# Patient Record
Sex: Male | Born: 1975
Health system: Southern US, Community
[De-identification: ages and names within clinical notes are randomized; demographics above are authoritative.]

## PROBLEM LIST (undated history)

## (undated) DIAGNOSIS — E8881 Metabolic syndrome: Secondary | ICD-10-CM

## (undated) DIAGNOSIS — F988 Other specified behavioral and emotional disorders with onset usually occurring in childhood and adolescence: Secondary | ICD-10-CM

## (undated) DIAGNOSIS — M109 Gout, unspecified: Secondary | ICD-10-CM

## (undated) DIAGNOSIS — K429 Umbilical hernia without obstruction or gangrene: Secondary | ICD-10-CM

## (undated) HISTORY — DX: Umbilical hernia without obstruction or gangrene: K42.9

## (undated) HISTORY — DX: Metabolic syndrome: E88.810

## (undated) HISTORY — DX: Gout, unspecified: M10.9

## (undated) HISTORY — DX: Metabolic syndrome: E88.81

## (undated) HISTORY — PX: OTHER SURGICAL HISTORY: SHX169

## (undated) HISTORY — DX: Other specified behavioral and emotional disorders with onset usually occurring in childhood and adolescence: F98.8

---

## 2008-02-01 ENCOUNTER — Emergency Department (HOSPITAL_COMMUNITY): Admission: EM | Admit: 2008-02-01 | Discharge: 2008-02-01 | Payer: Self-pay | Admitting: Emergency Medicine
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
## 2011-01-04 LAB — DIFFERENTIAL
Basophils Absolute: 0
Basophils Relative: 1
Eosinophils Absolute: 0.1
Eosinophils Relative: 2
Lymphs Abs: 2.2
Neutro Abs: 6.2
Neutrophils Relative %: 67

## 2011-01-04 LAB — COMPREHENSIVE METABOLIC PANEL
AST: 25
Albumin: 3.9
Alkaline Phosphatase: 87
Calcium: 9
Chloride: 107
Creatinine, Ser: 0.74
GFR calc Af Amer: >60
GFR calc non Af Amer: >60
Glucose, Bld: 85
Total Protein: 6.6

## 2011-01-04 LAB — URINALYSIS, ROUTINE W REFLEX MICROSCOPIC
Glucose, UA: NEGATIVE
Hgb urine dipstick: NEGATIVE
Nitrite: NEGATIVE
Protein, ur: NEGATIVE
Specific Gravity, Urine: >1.046 — ABNORMAL HIGH

## 2011-01-04 LAB — CBC
HCT: 45.6
Hemoglobin: 15.3
MCHC: 33.5
MCV: 90
RBC: 5.06

## 2011-01-04 LAB — LIPASE, BLOOD: Lipase: 30

## 2015-06-08 ENCOUNTER — Encounter: Payer: Self-pay | Admitting: Family Medicine

## 2015-06-08 ENCOUNTER — Ambulatory Visit (INDEPENDENT_AMBULATORY_CARE_PROVIDER_SITE_OTHER): Payer: BLUE CROSS/BLUE SHIELD | Admitting: Family Medicine

## 2015-06-08 VITALS — BP 118/86 | HR 86 | Temp 99.0°F | Resp 18 | Ht 67.0 in | Wt 248.0 lb

## 2015-06-08 DIAGNOSIS — J019 Acute sinusitis, unspecified: Secondary | ICD-10-CM

## 2015-06-08 MED ORDER — HYDROCODONE-HOMATROPINE 5-1.5 MG/5ML PO SYRP
5.0000 mL | ORAL_SOLUTION | Freq: Three times a day (TID) | ORAL | Status: DC | PRN
Start: 2015-06-08 — End: 2019-03-05

## 2015-06-08 MED ORDER — AMOXICILLIN 875 MG PO TABS: 875.0000 mg | ORAL_TABLET | Freq: Two times a day (BID) | ORAL | Status: DC

## 2015-06-08 NOTE — Progress Notes (Signed)
   Subjective:    Patient ID: Peter Whitehead, male    DOB: 03/27/1976, 40 y.o.   MRN: [id number]  HPI Patient had symptoms of a cold starting last week about 7 days ago. He complains now of pain in his maxillary and frontal sinuses bilaterally. He also complains of right ear pain. He complains of a severe sinus headache. He complains of a nonproductive cough that is worse when he lies down at night. He denies any body aches. He does complain of fatigue. He complains of a low-grade fever. On examination today the right tympanic membrane is erythematous dull and bulging.  He denies any significant past medical history a past surgical history. No current outpatient prescriptions on file prior to visit.   No current facility-administered medications on file prior to visit.   No Known Allergies Social History   Social History  . Marital Status: Unknown    Spouse Name: N/A  . Number of Children: N/A  . Years of Education: N/A   Occupational History  . Not on file.   Social History Main Topics  . Smoking status: Former Games developermoker  . Smokeless tobacco: Not on file  . Alcohol Use: 0.0 oz/week    0 Standard drinks or equivalent per week     Comment: Occasional  . Drug Use: No  . Sexual Activity: Not on file   Other Topics Concern  . Not on file   Social History Narrative  . No narrative on file    Review of Systems  All other systems reviewed and are negative.      Objective:   Physical Exam  Constitutional: He appears well-developed and well-nourished.  HENT:  Head: Normocephalic.  Right Ear: External ear and ear canal normal. Tympanic membrane is erythematous and bulging.  Left Ear: Tympanic membrane, external ear and ear canal normal.  Nose: Mucosal edema and rhinorrhea present. Right sinus exhibits frontal sinus tenderness. Left sinus exhibits frontal sinus tenderness.  Mouth/Throat: Oropharynx is clear and moist. No oropharyngeal exudate.  Cardiovascular: Normal  rate, regular rhythm and normal heart sounds.   Pulmonary/Chest: Effort normal and breath sounds normal. No respiratory distress. He has no wheezes. He has no rales.  Skin: He is not diaphoretic.  Vitals reviewed.         Assessment & Plan:  Acute rhinosinusitis - Plan: amoxicillin (AMOXIL) 875 MG tablet, HYDROcodone-homatropine (HYCODAN) 5-1.5 MG/5ML syrup  Patient has a sinus infection and also developing a right-sided ear infection. Begin amoxicillin 875 mg by mouth twice a day for 10 days. He can continue to use Mucinex and Sudafed for congestion. Also recommended Hycodan 1 teaspoon every 8 hours as needed for cough

## 2016-08-01 ENCOUNTER — Encounter: Payer: BLUE CROSS/BLUE SHIELD | Admitting: Family Medicine

## 2016-12-27 ENCOUNTER — Encounter (INDEPENDENT_AMBULATORY_CARE_PROVIDER_SITE_OTHER): Payer: Self-pay | Admitting: Orthopaedic Surgery

## 2016-12-27 ENCOUNTER — Ambulatory Visit (INDEPENDENT_AMBULATORY_CARE_PROVIDER_SITE_OTHER): Payer: BLUE CROSS/BLUE SHIELD | Admitting: Orthopaedic Surgery

## 2016-12-27 DIAGNOSIS — G8929 Other chronic pain: Secondary | ICD-10-CM | POA: Diagnosis not present

## 2016-12-27 DIAGNOSIS — M25511 Pain in right shoulder: Secondary | ICD-10-CM | POA: Diagnosis not present

## 2016-12-27 NOTE — Progress Notes (Signed)
   Office Visit Note   Patient: Peter Whitehead           Date of Birth: 21-Mar-1976           MRN: [id number] Visit Date: 12/27/2016              Requested by: No referring provider defined for this encounter. PCP: Donita Brooks, MD   Assessment & Plan: Visit Diagnoses:  1. Chronic right shoulder pain     Plan: Overall impression is acromioclavicular arthritis versus subacromial bursitis. Subacromial injection was performed today. Patient tolerated well. Follow-up as follow-up as needed. needed.  Follow-Up Instructions: Return if symptoms worsen or fail to improve.   Orders:  No orders of the defined types were placed in this encounter.  No orders of the defined types were placed in this encounter.     Procedures: Large Joint Inj Date/Time: 12/27/2016 12:08 PM Performed by: Tarry Kos Authorized by: Tarry Kos   Consent Given by:  Patient Timeout: prior to procedure the correct patient, procedure, and site was verified   Indications:  Pain Location:  Shoulder Site:  R subacromial bursa Prep: patient was prepped and draped in usual sterile fashion   Needle Size:  22 G Approach:  Posterior Ultrasound Guidance: No   Fluoroscopic Guidance: No       Clinical Data: No additional findings.   Subjective: Chief Complaint  Patient presents with  . Right Shoulder - Pain, Follow-up    Patient comes in today for recurrent right shoulder pain. I previously saw him years ago for subacromial bursitis which resolved after an injection. He plays the drums and has difficulty with this. Denies any numbness and tingling or injuries.    Review of Systems  Constitutional: Negative.   All other systems reviewed and are negative.    Objective: Vital Signs: There were no vitals taken for this visit.  Physical Exam  Constitutional: He is oriented to person, place, and time. He appears well-developed and well-nourished.  Pulmonary/Chest: Effort normal.    Abdominal: Soft.  Neurological: He is alert and oriented to person, place, and time.  Skin: Skin is warm.  Psychiatric: He has a normal mood and affect. His behavior is normal. Judgment and thought content normal.  Nursing note and vitals reviewed.   Ortho Exam Right shoulder exam shows mildly positive impingement and cross adduction. Negative crank test. Rotator cuff is normal. Specialty Comments:  No specialty comments available.  Imaging: No results found.   PMFS History: There are no active problems to display for this patient.  No past medical history on file.  No family history on file.  No past surgical history on file. Social History   Occupational History  . Not on file.   Social History Main Topics  . Smoking status: Former Games developer  . Smokeless tobacco: Not on file  . Alcohol use 0.0 oz/week     Comment: Occasional  . Drug use: No  . Sexual activity: Not on file

## 2018-11-06 ENCOUNTER — Other Ambulatory Visit: Payer: Self-pay

## 2018-11-06 ENCOUNTER — Telehealth: Payer: Self-pay | Admitting: Family

## 2018-11-06 DIAGNOSIS — Z20822 Contact with and (suspected) exposure to covid-19: Secondary | ICD-10-CM

## 2018-11-06 NOTE — Progress Notes (Signed)
E-Visit for Corona Virus Screening   Your current symptoms could be consistent with the coronavirus.  Many health care providers can now test patients at their office but not all are.  [facility] has multiple testing sites. For information on our COVID testing locations and hours go to [email or web address]  Please quarantine yourself while awaiting your test results.  We are enrolling you in our MyChart Home Montioring for COVID19 . Daily you will receive a questionnaire within the MyChart website. Our COVID 19 response team willl be monitoriing your responses daily.    COVID-19 is a respiratory illness with symptoms that are similar to the flu. Symptoms are typically mild to moderate, but there have been cases of severe illness and death due to the virus. The following symptoms may appear 2-14 days after exposure: . Fever . Cough . Shortness of breath or difficulty breathing . Chills . Repeated shaking with chills . Muscle pain . Headache . Sore throat . New loss of taste or smell . Fatigue . Congestion or runny nose . Nausea or vomiting . Diarrhea  It is vitally important that if you feel that you have an infection such as this virus or any other virus that you stay home and away from places where you may spread it to others.  You should self-quarantine for 14 days if you have symptoms that could potentially be coronavirus or have been in close contact a with a person diagnosed with COVID-19 within the last 2 weeks. You should avoid contact with people age 65 and older.   You should wear a mask or cloth face covering over your nose and mouth if you must be around other people or animals, including pets (even at home). Try to stay at least 6 feet away from other people. This will protect the people around you.  You may also take acetaminophen (Tylenol) as needed for fever.   Reduce your risk of any infection by using the same precautions used for avoiding the  common cold or flu:  . Wash your hands often with soap and warm water for at least 20 seconds.  If soap and water are not readily available, use an alcohol-based hand sanitizer with at least 60% alcohol.  . If coughing or sneezing, cover your mouth and nose by coughing or sneezing into the elbow areas of your shirt or coat, into a tissue or into your sleeve (not your hands). . Avoid shaking hands with others and consider head nods or verbal greetings only. . Avoid touching your eyes, nose, or mouth with unwashed hands.  . Avoid close contact with people who are sick. . Avoid places or events with large numbers of people in one location, like concerts or sporting events. . Carefully consider travel plans you have or are making. . If you are planning any travel outside or inside the [place], visit the CDC's Travelers' Health webpage for the latest health notices. . If you have some symptoms but not all symptoms, continue to monitor at home and seek medical attention if your symptoms worsen. . If you are having a medical emergency, call 911.  HOME CARE . Only take medications as instructed by your medical team. . Drink plenty of fluids and get plenty of rest. . A steam or ultrasonic humidifier can help if you have congestion.   GET HELP RIGHT AWAY IF YOU HAVE EMERGENCY WARNING SIGNS** FOR COVID-19. If you or someone is showing any of these signs seek emergency medical care immediately. Call   911 or proceed to your closest emergency facility if: . You develop worsening high fever. . Trouble breathing . Bluish lips or face . Persistent pain or pressure in the chest . New confusion . Inability to wake or stay awake . You cough up blood. . Your symptoms become more severe  **This list is not all possible symptoms. Contact your medical provider for any symptoms that are sever or concerning to you.   MAKE SURE YOU   Understand these instructions.  Will watch your condition.  Will get help right  away if you are not doing well or get worse.  Your e-visit answers were reviewed by a board certified advanced clinical practitioner to complete your personal care plan.  Depending on the condition, your plan could have included both over the counter or prescription medications.  If there is a problem please reply once you have received a response from your provider.  Your safety is important to us.  If you have drug allergies check your prescription carefully.    You can use MyChart to ask questions about today's visit, request a non-urgent call back, or ask for a work or school excuse for 24 hours related to this e-Visit. If it has been greater than 24 hours you will need to follow up with your provider, or enter a new e-Visit to address those concerns. You will get an e-mail in the next two days asking about your experience.  I hope that your e-visit has been valuable and will speed your recovery. Thank you for using e-visits.   Greater than 5 minutes, yet less than 10 minutes of time have been spent researching, coordinating, and implementing care for this patient today.  Thank you for the details you included in the comment boxes. Those details are very helpful in determining the best course of treatment for you and help us to provide the best care.  

## 2018-11-07 LAB — NOVEL CORONAVIRUS, NAA: SARS-CoV-2, NAA: NOT DETECTED

## 2019-03-05 ENCOUNTER — Encounter: Payer: Self-pay | Admitting: Family Medicine

## 2019-03-05 ENCOUNTER — Other Ambulatory Visit: Payer: Self-pay

## 2019-03-05 ENCOUNTER — Ambulatory Visit: Payer: 59 | Admitting: Family Medicine

## 2019-03-05 VITALS — BP 128/66 | HR 94 | Temp 98.2°F | Resp 16 | Ht 67.0 in | Wt 275.0 lb

## 2019-03-05 DIAGNOSIS — M79671 Pain in right foot: Secondary | ICD-10-CM

## 2019-03-05 DIAGNOSIS — F5104 Psychophysiologic insomnia: Secondary | ICD-10-CM | POA: Diagnosis not present

## 2019-03-05 MED ORDER — MITIGARE 0.6 MG PO CAPS: 1.0000 | ORAL_CAPSULE | Freq: Every day | ORAL | 0 refills | Status: DC

## 2019-03-05 MED ORDER — COLCHICINE 0.6 MG PO TABS: 0.6000 mg | ORAL_TABLET | Freq: Every day | ORAL | 0 refills | Status: DC

## 2019-03-05 MED ORDER — ZOLPIDEM TARTRATE 10 MG PO TABS: 10.0000 mg | ORAL_TABLET | Freq: Every evening | ORAL | 1 refills | Status: DC | PRN

## 2019-03-05 NOTE — Progress Notes (Signed)
Subjective:    Patient ID: Peter Whitehead, male    DOB: 01-08-1976, 43 y.o.   MRN: [id number]  Patient presents for R Foot Pain (x2 weeks- pain on top/ bottom of foot- has been using tart cherry juice, aleve, advil) Patient here with right foot pain for the past 2 weeks.  He feels significant pain on the top of his fifth and left foot area as well as the bottom and the medicine tarsal arch area between the third and fourth digit.  He states that he feels like a burning sensation in that region.  He states if he turns over in bed covers hit his foot he will have pain.  Certain shoes also cause significant pain.  He does admit to drinking some beer and eating red meat he was concerned about gout.  He has been taking Aleve cherry tart juice over the past week or so and that has helped.  He has not had any injury to the foot that he is aware of.  His other concern is to sleep.  States that he takes 2 Benadryl at night as well as melatonin to help him sleep.  In the past he was on Ambien when he was going through some depressive episodes.  He was also on Adderall during the day and another antidepressant.  He is willing to try something else for his sleep. He denies any excessive snoring.  States that he does go to bed early if not he will feel more fatigued that he is often in the bed by 830 or 9:00 at the latest.  Note he is overdue for a physical exam.      Review Of Systems:  GEN- denies fatigue, fever, weight loss,weakness, recent illness HEENT- denies eye drainage, change in vision, nasal discharge, CVS- denies chest pain, palpitations RESP- denies SOB, cough, wheeze ABD- denies N/V, change in stools, abd pain GU- denies dysuria, hematuria, dribbling, incontinence MSK- + joint pain, muscle aches, injury Neuro- denies headache, dizziness, syncope, seizure activity       Objective:    BP 128/66   Pulse 94   Temp 98.2 F (36.8 C) (Temporal)   Resp 16   Ht 5\' 7"  (1.702 m)    Wt 275 lb (124.7 kg)   SpO2 99%   BMI 43.07 kg/m  GEN- NAD, alert and oriented x3,obese  CVS- RRR, no murmur RESP-CTAB EXT- Right foot, no erythema, TTP mid foot, below 3rd and 4th digits, and on sole, heel NT, FROM ankle, no swelling noted, no podagra  Pulses- Radial, DP- 2+        Assessment & Plan:      Problem List Items Addressed This Visit      Unprioritized   Chronic insomnia    Try him on Ambien.  He will schedule a physical exam.  I did query sleep apnea especially with his habitus.  Can be discussed further at his physical exam.  Regarding his right foot pain differentials include gout versus some type of tendinitis or even a neuroma on the sole of the foot.  We will check a uric acid level we will go ahead and start him on colchicine as the anti-inflammatories did help and he would like to try this.  Reduce the purine-based foods. If his levels do come back normal and no improvement with colchicine I will refer him to podiatry.       Other Visit Diagnoses    Right foot pain    -  Primary   Relevant Orders   CBC with Differential (Completed)   Comprehensive metabolic panel (Completed)   Uric Acid (Completed)      Note: This dictation was prepared with Dragon dictation along with smaller phrase technology. Any transcriptional errors that result from this process are unintentional.

## 2019-03-05 NOTE — Patient Instructions (Addendum)
Take Colchicine tablet 1 tablet, repeat repeat in 1 hour  Try the ambien for sleep  F/U Schedule physical Dr. Dennard Schaumann- he needs for work before end of year

## 2019-03-06 ENCOUNTER — Encounter: Payer: Self-pay | Admitting: Family Medicine

## 2019-03-06 LAB — COMPREHENSIVE METABOLIC PANEL
AG Ratio: 1.6 (calc) (ref 1.0–2.5)
ALT: 48 U/L — ABNORMAL HIGH (ref 9–46)
AST: 28 U/L (ref 10–40)
Albumin: 4.6 g/dL (ref 3.6–5.1)
Alkaline phosphatase (APISO): 82 U/L (ref 36–130)
BUN: 16 mg/dL (ref 7–25)
CO2: 21 mmol/L (ref 20–32)
Calcium: 9.5 mg/dL (ref 8.6–10.3)
Chloride: 103 mmol/L (ref 98–110)
Creat: 0.89 mg/dL (ref 0.60–1.35)
Globulin: 2.8 g/dL (calc) (ref 1.9–3.7)
Glucose, Bld: 116 mg/dL — ABNORMAL HIGH (ref 65–99)
Potassium: 4 mmol/L (ref 3.5–5.3)
Sodium: 138 mmol/L (ref 135–146)
Total Bilirubin: 0.4 mg/dL (ref 0.2–1.2)
Total Protein: 7.4 g/dL (ref 6.1–8.1)

## 2019-03-06 LAB — CBC WITH DIFFERENTIAL/PLATELET
Absolute Monocytes: 862 cells/uL (ref 200–950)
Basophils Absolute: 88 cells/uL (ref 0–200)
Basophils Relative: 0.9 %
Eosinophils Absolute: 206 cells/uL (ref 15–500)
Eosinophils Relative: 2.1 %
HCT: 48.8 % (ref 38.5–50.0)
Hemoglobin: 16.9 g/dL (ref 13.2–17.1)
Lymphs Abs: 2479 cells/uL (ref 850–3900)
MCH: 29.9 pg (ref 27.0–33.0)
MCHC: 34.6 g/dL (ref 32.0–36.0)
MCV: 86.2 fL (ref 80.0–100.0)
MPV: 11.7 fL (ref 7.5–12.5)
Monocytes Relative: 8.8 %
Neutro Abs: 6164 cells/uL (ref 1500–7800)
Neutrophils Relative %: 62.9 %
Platelets: 254 10*3/uL (ref 140–400)
RBC: 5.66 10*6/uL (ref 4.20–5.80)
RDW: 12.1 % (ref 11.0–15.0)
Total Lymphocyte: 25.3 %
WBC: 9.8 10*3/uL (ref 3.8–10.8)

## 2019-03-06 LAB — URIC ACID: Uric Acid, Serum: 6.3 mg/dL (ref 4.0–8.0)

## 2019-03-06 NOTE — Assessment & Plan Note (Signed)
Try him on Ambien.  He will schedule a physical exam.  I did query sleep apnea especially with his habitus.  Can be discussed further at his physical exam.  Regarding his right foot pain differentials include gout versus some type of tendinitis or even a neuroma on the sole of the foot.  We will check a uric acid level we will go ahead and start him on colchicine as the anti-inflammatories did help and he would like to try this.  Reduce the purine-based foods. If his levels do come back normal and no improvement with colchicine I will refer him to podiatry.

## 2019-03-11 ENCOUNTER — Encounter: Payer: Self-pay | Admitting: Family Medicine

## 2019-03-11 MED ORDER — PREDNISONE 10 MG PO TABS: ORAL_TABLET | ORAL | 0 refills | Status: DC

## 2019-05-09 ENCOUNTER — Other Ambulatory Visit: Payer: Self-pay

## 2019-05-09 ENCOUNTER — Encounter: Payer: Self-pay | Admitting: Family Medicine

## 2019-05-09 ENCOUNTER — Ambulatory Visit (INDEPENDENT_AMBULATORY_CARE_PROVIDER_SITE_OTHER): Payer: BC Managed Care – PPO | Admitting: Family Medicine

## 2019-05-09 VITALS — BP 130/90 | HR 74 | Temp 97.4°F | Resp 16 | Ht 67.0 in | Wt 260.0 lb

## 2019-05-09 DIAGNOSIS — Z0000 Encounter for general adult medical examination without abnormal findings: Secondary | ICD-10-CM

## 2019-05-09 DIAGNOSIS — Z0001 Encounter for general adult medical examination with abnormal findings: Secondary | ICD-10-CM

## 2019-05-09 DIAGNOSIS — Z8739 Personal history of other diseases of the musculoskeletal system and connective tissue: Secondary | ICD-10-CM | POA: Diagnosis not present

## 2019-05-09 DIAGNOSIS — K429 Umbilical hernia without obstruction or gangrene: Secondary | ICD-10-CM

## 2019-05-09 MED ORDER — ZOLPIDEM TARTRATE 10 MG PO TABS: 10.0000 mg | ORAL_TABLET | Freq: Every evening | ORAL | 1 refills | Status: DC | PRN

## 2019-05-09 NOTE — Progress Notes (Signed)
Subjective:    Patient ID: Peter Whitehead, male    DOB: 1976-03-24, 44 y.o.   MRN: [id number]  HPI Patient is here today for a complete physical exam.  He denies any medical concerns.  He has no significant past medical history however family history is concerning for a father had a myocardial infarction as well as peripheral artery disease in his late 2s early 2s.  He also had a AAA that required emergent correction.  Patient has a remote history of smoking but quit smoking 5 years ago.  He denies any chest pain, shortness of breath, dyspnea on exertion.  He is due for a flu shot as well as a tetanus shot but he declines both of these today.  He is also due for an HIV test however the patient declines that as well.   Past Medical History:  Diagnosis Date  . ADD (attention deficit disorder)    treated during childhood  . Hernia, umbilical    Past Surgical History:  Procedure Laterality Date  . pyloric stenosis     Current Outpatient Medications on File Prior to Visit  Medication Sig Dispense Refill  . FIBER ADULT GUMMIES PO Take by mouth.    . levocetirizine (XYZAL) 5 MG tablet Take 5 mg by mouth every evening.    . Misc Natural Products (TART CHERRY ADVANCED) CAPS Take by mouth.    . zolpidem (AMBIEN) 10 MG tablet Take 1 tablet (10 mg total) by mouth at bedtime as needed for sleep. (Patient not taking: Reported on 05/09/2019) 30 tablet 1   No current facility-administered medications on file prior to visit.   Allergies  Allergen Reactions  . Hydrocodone     Irritability    Social History   Socioeconomic History  . Marital status: Legally Separated    Spouse name: Not on file  . Number of children: Not on file  . Years of education: Not on file  . Highest education level: Not on file  Occupational History  . Not on file  Tobacco Use  . Smoking status: Former Games developer  . Smokeless tobacco: Never Used  Substance and Sexual Activity  . Alcohol use: Yes   Alcohol/week: 0.0 standard drinks    Comment: Occasional  . Drug use: No  . Sexual activity: Not on file  Other Topics Concern  . Not on file  Social History Narrative  . Not on file   Social Determinants of Health   Financial Resource Strain:   . Difficulty of Paying Living Expenses: Not on file  Food Insecurity:   . Worried About Programme researcher, broadcasting/film/video in the Last Year: Not on file  . Ran Out of Food in the Last Year: Not on file  Transportation Needs:   . Lack of Transportation (Medical): Not on file  . Lack of Transportation (Non-Medical): Not on file  Physical Activity:   . Days of Exercise per Week: Not on file  . Minutes of Exercise per Session: Not on file  Stress:   . Feeling of Stress : Not on file  Social Connections:   . Frequency of Communication with Friends and Family: Not on file  . Frequency of Social Gatherings with Friends and Family: Not on file  . Attends Religious Services: Not on file  . Active Member of Clubs or Organizations: Not on file  . Attends Banker Meetings: Not on file  . Marital Status: Not on file  Intimate Partner Violence:   .  Fear of Current or Ex-Partner: Not on file  . Emotionally Abused: Not on file  . Physically Abused: Not on file  . Sexually Abused: Not on file   Family History  Problem Relation Age of Onset  . Arthritis Mother        rheumatoid  . Heart disease Father   . Hyperlipidemia Father   . Hypertension Father   . AAA (abdominal aortic aneurysm) Father       Review of Systems  All other systems reviewed and are negative.      Objective:   Physical Exam Vitals reviewed.  Constitutional:      General: He is not in acute distress.    Appearance: Normal appearance. He is not ill-appearing, toxic-appearing or diaphoretic.  HENT:     Head: Normocephalic and atraumatic.     Right Ear: Tympanic membrane, ear canal and external ear normal.     Left Ear: Tympanic membrane, ear canal and external ear  normal.     Nose: Nose normal. No congestion or rhinorrhea.     Mouth/Throat:     Mouth: Mucous membranes are moist.     Pharynx: Oropharynx is clear. No oropharyngeal exudate or posterior oropharyngeal erythema.  Eyes:     General: No scleral icterus.       Right eye: No discharge.        Left eye: No discharge.     Extraocular Movements: Extraocular movements intact.     Conjunctiva/sclera: Conjunctivae normal.     Pupils: Pupils are equal, round, and reactive to light.  Neck:     Vascular: No carotid bruit.  Cardiovascular:     Rate and Rhythm: Normal rate and regular rhythm.     Pulses: Normal pulses.     Heart sounds: Normal heart sounds. No murmur. No friction rub. No gallop.   Pulmonary:     Effort: Pulmonary effort is normal. No respiratory distress.     Breath sounds: Normal breath sounds. No stridor. No wheezing, rhonchi or rales.  Chest:     Chest wall: No tenderness.  Abdominal:     General: Abdomen is flat. Bowel sounds are normal. There is no distension.     Palpations: Abdomen is soft. There is no mass.     Tenderness: There is no abdominal tenderness. There is no right CVA tenderness, left CVA tenderness, guarding or rebound.     Hernia: A hernia is present.  Musculoskeletal:        General: No signs of injury. Normal range of motion.     Cervical back: Neck supple. No rigidity or tenderness.     Right lower leg: No edema.     Left lower leg: No edema.  Lymphadenopathy:     Cervical: No cervical adenopathy.  Skin:    General: Skin is warm.     Coloration: Skin is not jaundiced or pale.     Findings: No bruising, erythema, lesion or rash.  Neurological:     General: No focal deficit present.     Mental Status: He is alert and oriented to person, place, and time. Mental status is at baseline.     Cranial Nerves: No cranial nerve deficit.     Sensory: No sensory deficit.     Motor: No weakness.     Coordination: Coordination normal.     Gait: Gait normal.      Deep Tendon Reflexes: Reflexes normal.  Psychiatric:        Mood and Affect:  Mood normal.        Behavior: Behavior normal.        Thought Content: Thought content normal.        Judgment: Judgment normal.           Assessment & Plan:  General medical exam - Plan: CBC with Differential/Platelet, COMPLETE METABOLIC PANEL WITH GFR, Lipid panel  Physical exam is significant only for an abdominal hernia/umbilical hernia.  This is a chronic finding that does not bother the patient.  He refuses a flu shot.  He refuses a tetanus shot.  He refuses HIV screening.  I will check a CBC, CMP, fasting lipid panel.  Previous lab work was significant for a mildly elevated liver function test making me concerned about possible fatty liver disease.  The patient also recently had a gout exacerbation.  He is aggressively tried to change his diet and is interested to see if he is lower his uric acid level in that way.  Most recent uric acid level was 6.3.  He is no longer smoking or dipping.  He drinks occasional alcohol.  I would have a low index to start the patient on a statin given his family history of cardiovascular disease.

## 2019-05-09 NOTE — Telephone Encounter (Signed)
Ok to refill??  Last office visit 05/09/2019.  Last refill 03/05/2019, #1 refills.

## 2019-05-10 ENCOUNTER — Encounter: Payer: Self-pay | Admitting: Family Medicine

## 2019-05-10 DIAGNOSIS — E8881 Metabolic syndrome: Secondary | ICD-10-CM | POA: Insufficient documentation

## 2019-05-10 DIAGNOSIS — M109 Gout, unspecified: Secondary | ICD-10-CM | POA: Insufficient documentation

## 2019-05-10 LAB — COMPLETE METABOLIC PANEL WITH GFR
AG Ratio: 1.9 (calc) (ref 1.0–2.5)
ALT: 44 U/L (ref 9–46)
AST: 28 U/L (ref 10–40)
Albumin: 4.8 g/dL (ref 3.6–5.1)
Alkaline phosphatase (APISO): 70 U/L (ref 36–130)
BUN: 11 mg/dL (ref 7–25)
CO2: 23 mmol/L (ref 20–32)
Calcium: 9.6 mg/dL (ref 8.6–10.3)
Chloride: 105 mmol/L (ref 98–110)
Creat: 0.87 mg/dL (ref 0.60–1.35)
GFR, Est African American: 123 mL/min/{1.73_m2} (ref >=60)
GFR, Est Non African American: 106 mL/min/{1.73_m2} (ref >=60)
Globulin: 2.5 g/dL (calc) (ref 1.9–3.7)
Glucose, Bld: 109 mg/dL — ABNORMAL HIGH (ref 65–99)
Potassium: 4.2 mmol/L (ref 3.5–5.3)
Sodium: 139 mmol/L (ref 135–146)
Total Bilirubin: 1 mg/dL (ref 0.2–1.2)
Total Protein: 7.3 g/dL (ref 6.1–8.1)

## 2019-05-10 LAB — LIPID PANEL
Cholesterol: 184 mg/dL (ref <200)
HDL: 39 mg/dL — ABNORMAL LOW (ref >=40)
LDL Cholesterol (Calc): 115 mg/dL (calc) — ABNORMAL HIGH
Non-HDL Cholesterol (Calc): 145 mg/dL (calc) — ABNORMAL HIGH (ref <130)
Total CHOL/HDL Ratio: 4.7 (calc) (ref <5.0)
Triglycerides: 186 mg/dL — ABNORMAL HIGH (ref <150)

## 2019-05-10 LAB — CBC WITH DIFFERENTIAL/PLATELET
Absolute Monocytes: 510 cells/uL (ref 200–950)
Basophils Absolute: 82 cells/uL (ref 0–200)
Basophils Relative: 1.2 %
Eosinophils Absolute: 231 cells/uL (ref 15–500)
Eosinophils Relative: 3.4 %
HCT: 48.3 % (ref 38.5–50.0)
Hemoglobin: 16.4 g/dL (ref 13.2–17.1)
Lymphs Abs: 2156 cells/uL (ref 850–3900)
MCH: 29.4 pg (ref 27.0–33.0)
MCHC: 34 g/dL (ref 32.0–36.0)
MCV: 86.7 fL (ref 80.0–100.0)
MPV: 11.1 fL (ref 7.5–12.5)
Monocytes Relative: 7.5 %
Neutro Abs: 3822 cells/uL (ref 1500–7800)
Neutrophils Relative %: 56.2 %
Platelets: 255 10*3/uL (ref 140–400)
RBC: 5.57 10*6/uL (ref 4.20–5.80)
RDW: 12.4 % (ref 11.0–15.0)
Total Lymphocyte: 31.7 %
WBC: 6.8 10*3/uL (ref 3.8–10.8)

## 2019-05-10 LAB — URIC ACID: Uric Acid, Serum: 7 mg/dL (ref 4.0–8.0)

## 2019-05-13 MED ORDER — MITIGARE 0.6 MG PO CAPS: 0.6000 mg | ORAL_CAPSULE | Freq: Every day | ORAL | 1 refills | Status: DC

## 2019-05-13 MED ORDER — COLCHICINE 0.6 MG PO TABS: 0.6000 mg | ORAL_TABLET | Freq: Every day | ORAL | 1 refills | Status: DC

## 2019-05-13 MED ORDER — ALLOPURINOL 100 MG PO TABS: 200.0000 mg | ORAL_TABLET | Freq: Every day | ORAL | 3 refills | Status: DC

## 2019-05-13 NOTE — Addendum Note (Signed)
Addended by: Legrand Rams B on: 05/13/2019 11:13 AM   Modules accepted: Orders

## 2019-05-13 NOTE — Telephone Encounter (Signed)
Medication called/sent to requested pharmacy  

## 2019-07-03 ENCOUNTER — Encounter: Payer: Self-pay | Admitting: Family Medicine

## 2019-07-04 MED ORDER — ZOLPIDEM TARTRATE 10 MG PO TABS: 10.0000 mg | ORAL_TABLET | Freq: Every evening | ORAL | 3 refills | Status: DC | PRN

## 2019-07-04 NOTE — Telephone Encounter (Signed)
Requesting refill    Ambien  LOV:  05/09/2019  LRF:  05/09/2019

## 2019-10-23 ENCOUNTER — Encounter: Payer: Self-pay | Admitting: Family Medicine

## 2019-10-23 MED ORDER — COLCHICINE 0.6 MG PO TABS: 0.6000 mg | ORAL_TABLET | Freq: Every day | ORAL | 1 refills | Status: DC

## 2019-10-23 MED ORDER — MITIGARE 0.6 MG PO CAPS: 0.6000 mg | ORAL_CAPSULE | Freq: Every day | ORAL | 1 refills | Status: DC

## 2019-10-23 MED ORDER — ALLOPURINOL 100 MG PO TABS: 200.0000 mg | ORAL_TABLET | Freq: Every day | ORAL | 3 refills | Status: DC

## 2019-10-30 ENCOUNTER — Other Ambulatory Visit: Payer: Self-pay

## 2019-10-30 ENCOUNTER — Encounter: Payer: Self-pay | Admitting: Family Medicine

## 2019-10-30 MED ORDER — ZOLPIDEM TARTRATE 10 MG PO TABS: 10.0000 mg | ORAL_TABLET | Freq: Every evening | ORAL | 3 refills | Status: DC | PRN

## 2019-10-30 NOTE — Telephone Encounter (Signed)
Last OV 05/09/19 °Last refill 07/04/19 °

## 2019-10-30 NOTE — Telephone Encounter (Signed)
Last OV 05/09/19 Last refill 07/04/19

## 2019-10-31 MED ORDER — ZOLPIDEM TARTRATE 10 MG PO TABS: 10.0000 mg | ORAL_TABLET | Freq: Every evening | ORAL | 3 refills | Status: DC | PRN

## 2019-10-31 NOTE — Telephone Encounter (Signed)
Please re-send as prescription was printed.  

## 2019-10-31 NOTE — Addendum Note (Signed)
Addended by: Phillips Odor on: 10/31/2019 08:25 AM   Modules accepted: Orders

## 2020-01-08 ENCOUNTER — Encounter: Payer: Self-pay | Admitting: Family Medicine

## 2020-01-08 MED ORDER — MITIGARE 0.6 MG PO CAPS: 0.6000 mg | ORAL_CAPSULE | Freq: Every day | ORAL | 3 refills | Status: DC

## 2020-01-10 MED ORDER — MITIGARE 0.6 MG PO CAPS: 0.6000 mg | ORAL_CAPSULE | Freq: Every day | ORAL | 3 refills | Status: DC

## 2020-02-21 ENCOUNTER — Encounter: Payer: Self-pay | Admitting: Family Medicine

## 2020-02-21 ENCOUNTER — Other Ambulatory Visit: Payer: Self-pay | Admitting: Family Medicine

## 2020-02-21 NOTE — Telephone Encounter (Signed)
Ok to refill??  Last office visit 05/09/2019.  Last refill 10/31/2019, #3 refills.

## 2020-03-19 ENCOUNTER — Other Ambulatory Visit: Payer: Self-pay

## 2020-03-19 MED ORDER — MITIGARE 0.6 MG PO CAPS: 0.6000 mg | ORAL_CAPSULE | Freq: Every day | ORAL | 3 refills | Status: DC

## 2020-06-14 ENCOUNTER — Encounter: Payer: Self-pay | Admitting: Family Medicine

## 2020-06-15 ENCOUNTER — Other Ambulatory Visit: Payer: Self-pay

## 2020-06-15 MED ORDER — ZOLPIDEM TARTRATE 10 MG PO TABS: 10.0000 mg | ORAL_TABLET | Freq: Every evening | ORAL | 3 refills | Status: DC | PRN

## 2020-06-15 NOTE — Telephone Encounter (Signed)
Ok to refill??  Last office visit 05/09/2019.  Last refill 02/21/2020, #3 refills.

## 2020-06-22 ENCOUNTER — Encounter: Payer: Self-pay | Admitting: Family Medicine

## 2020-06-22 ENCOUNTER — Other Ambulatory Visit: Payer: Self-pay

## 2020-06-22 ENCOUNTER — Ambulatory Visit (INDEPENDENT_AMBULATORY_CARE_PROVIDER_SITE_OTHER): Payer: BC Managed Care – PPO | Admitting: Family Medicine

## 2020-06-22 VITALS — BP 130/78 | HR 80 | Temp 98.6°F | Resp 14 | Ht 67.0 in | Wt 223.0 lb

## 2020-06-22 DIAGNOSIS — E8881 Metabolic syndrome: Secondary | ICD-10-CM | POA: Diagnosis not present

## 2020-06-22 DIAGNOSIS — Z0000 Encounter for general adult medical examination without abnormal findings: Secondary | ICD-10-CM

## 2020-06-22 DIAGNOSIS — Z0001 Encounter for general adult medical examination with abnormal findings: Secondary | ICD-10-CM

## 2020-06-22 DIAGNOSIS — Z8739 Personal history of other diseases of the musculoskeletal system and connective tissue: Secondary | ICD-10-CM | POA: Diagnosis not present

## 2020-06-22 MED ORDER — MONTELUKAST SODIUM 10 MG PO TABS: 10.0000 mg | ORAL_TABLET | Freq: Every day | ORAL | 3 refills | Status: DC

## 2020-06-22 NOTE — Progress Notes (Signed)
Subjective:    Patient ID: Peter Whitehead, male    DOB: [birth year], 45 y.o.   MRN: [id number]  HPI Patient is here today for a complete physical exam.  He denies any medical concerns.  He has no significant past medical history however family history is concerning for a father had a myocardial infarction as well as peripheral artery disease in his late 87s early 11s.  He also had a AAA that required emergent correction.  Patient has a remote history of smoking but quit smoking.  He is on allopurinol 200 mg a day however he continues to have a pressure-like sensation in his first MTP joint in his right foot if he does not take Medicare every day.  This leads me to suspect that his uric acid level is still greater than 6.  He denies any side effects from the allopurinol or the Mitigare.  He is taking Xyzal and Flonase on a daily basis for allergies however he continues to have hayfever type symptoms including runny nose and itchy watery eyes despite taking both these medications regularly.  He does not want to consider allergy shots yet.  He also has a history of what sounds like alpha gal allergy as he reports getting hives if he eats a rare steak or eats a lot of meat at a [place] steak house however he denies angioedema. Past Medical History:  Diagnosis Date  . ADD (attention deficit disorder)    treated during childhood  . Gout   . Hernia, umbilical   . Metabolic syndrome    Past Surgical History:  Procedure Laterality Date  . pyloric stenosis     Current Outpatient Medications on File Prior to Visit  Medication Sig Dispense Refill  . allopurinol (ZYLOPRIM) 100 MG tablet Take 2 tablets (200 mg total) by mouth daily. 180 tablet 3  . FIBER ADULT GUMMIES PO Take by mouth.    . fluticasone (FLONASE) 50 MCG/ACT nasal spray Place into both nostrils daily.    Marland Kitchen levocetirizine (XYZAL) 5 MG tablet Take 5 mg by mouth every evening.    . Misc Natural Products (TART CHERRY ADVANCED) CAPS  Take by mouth.    Marland Kitchen MITIGARE 0.6 MG CAPS Take 0.6 mg by mouth daily. 90 capsule 3  . naproxen sodium (ALEVE) 220 MG tablet Take 220 mg by mouth.    . zolpidem (AMBIEN) 10 MG tablet Take 1 tablet (10 mg total) by mouth at bedtime as needed. for sleep 30 tablet 3   No current facility-administered medications on file prior to visit.   Allergies  Allergen Reactions  . Hydrocodone     Irritability    Social History   Socioeconomic History  . Marital status: Legally Separated    Spouse name: Not on file  . Number of children: Not on file  . Years of education: Not on file  . Highest education level: Not on file  Occupational History  . Not on file  Tobacco Use  . Smoking status: Former Games developer  . Smokeless tobacco: Never Used  Vaping Use  . Vaping Use: Never used  Substance and Sexual Activity  . Alcohol use: Yes    Alcohol/week: 0.0 standard drinks    Comment: Occasional  . Drug use: No  . Sexual activity: Not on file  Other Topics Concern  . Not on file  Social History Narrative  . Not on file   Social Determinants of Health   Financial Resource Strain: Not on file  Food Insecurity: Not on file  Transportation Needs: Not on file  Physical Activity: Not on file  Stress: Not on file  Social Connections: Not on file  Intimate Partner Violence: Not on file   Family History  Problem Relation Age of Onset  . Arthritis Mother        rheumatoid  . Heart disease Father   . Hyperlipidemia Father   . Hypertension Father   . AAA (abdominal aortic aneurysm) Father       Review of Systems  All other systems reviewed and are negative.      Objective:   Physical Exam Vitals reviewed.  Constitutional:      General: He is not in acute distress.    Appearance: Normal appearance. He is not ill-appearing, toxic-appearing or diaphoretic.  HENT:     Head: Normocephalic and atraumatic.     Right Ear: Tympanic membrane, ear canal and external ear normal.     Left Ear:  Tympanic membrane, ear canal and external ear normal.     Nose: Nose normal. No congestion or rhinorrhea.     Mouth/Throat:     Mouth: Mucous membranes are moist.     Pharynx: Oropharynx is clear. No oropharyngeal exudate or posterior oropharyngeal erythema.  Eyes:     General: No scleral icterus.       Right eye: No discharge.        Left eye: No discharge.     Extraocular Movements: Extraocular movements intact.     Conjunctiva/sclera: Conjunctivae normal.     Pupils: Pupils are equal, round, and reactive to light.  Neck:     Vascular: No carotid bruit.  Cardiovascular:     Rate and Rhythm: Normal rate and regular rhythm.     Pulses: Normal pulses.     Heart sounds: Normal heart sounds. No murmur heard. No friction rub. No gallop.   Pulmonary:     Effort: Pulmonary effort is normal. No respiratory distress.     Breath sounds: Normal breath sounds. No stridor. No wheezing, rhonchi or rales.  Chest:     Chest wall: No tenderness.  Abdominal:     General: Abdomen is flat. Bowel sounds are normal. There is no distension.     Palpations: Abdomen is soft. There is no mass.     Tenderness: There is no abdominal tenderness. There is no right CVA tenderness, left CVA tenderness, guarding or rebound.     Hernia: A hernia is present.  Musculoskeletal:        General: No signs of injury. Normal range of motion.     Cervical back: Neck supple. No rigidity or tenderness.     Right lower leg: No edema.     Left lower leg: No edema.  Lymphadenopathy:     Cervical: No cervical adenopathy.  Skin:    General: Skin is warm.     Coloration: Skin is not jaundiced or pale.     Findings: No bruising, erythema, lesion or rash.  Neurological:     General: No focal deficit present.     Mental Status: He is alert and oriented to person, place, and time. Mental status is at baseline.     Cranial Nerves: No cranial nerve deficit.     Sensory: No sensory deficit.     Motor: No weakness.      Coordination: Coordination normal.     Gait: Gait normal.     Deep Tendon Reflexes: Reflexes normal.  Psychiatric:  Mood and Affect: Mood normal.        Behavior: Behavior normal.        Thought Content: Thought content normal.        Judgment: Judgment normal.           Assessment & Plan:  Metabolic syndrome - Plan: CBC with Differential/Platelet, COMPLETE METABOLIC PANEL WITH GFR, Lipid panel, Uric acid  General medical exam - Plan: CBC with Differential/Platelet, COMPLETE METABOLIC PANEL WITH GFR, Lipid panel, Uric acid  History of gout - Plan: Uric acid  Physical exam is significant only for an abdominal hernia/umbilical hernia.  This is a chronic finding that does not bother the patient.  Given his family history, I would recommend a statin if his LDL cholesterol is above 100.  I would also recommend adding Singulair 10 mg a day to his Xyzal and Flonase.  I will check a CBC, CMP, lipid panel and a uric acid level.  If uric acid is greater than 6 I would increase allopurinol to 300 mg a day.  I would also recommend checking the patient for a AAA when he turns 50 as his father was diagnosed with a AAA when he was 60.

## 2020-06-23 LAB — LIPID PANEL
Cholesterol: 156 mg/dL (ref <200)
HDL: 40 mg/dL (ref >=40)
LDL Cholesterol (Calc): 96 mg/dL (calc)
Non-HDL Cholesterol (Calc): 116 mg/dL (calc) (ref <130)
Total CHOL/HDL Ratio: 3.9 (calc) (ref <5.0)
Triglycerides: 100 mg/dL (ref <150)

## 2020-06-23 LAB — CBC WITH DIFFERENTIAL/PLATELET
Absolute Monocytes: 449 cells/uL (ref 200–950)
Basophils Absolute: 92 cells/uL (ref 0–200)
Basophils Relative: 1.4 %
Eosinophils Absolute: 172 cells/uL (ref 15–500)
Eosinophils Relative: 2.6 %
HCT: 47.1 % (ref 38.5–50.0)
Hemoglobin: 16.3 g/dL (ref 13.2–17.1)
Lymphs Abs: 1762 cells/uL (ref 850–3900)
MCH: 30.8 pg (ref 27.0–33.0)
MCHC: 34.6 g/dL (ref 32.0–36.0)
MCV: 88.9 fL (ref 80.0–100.0)
MPV: 11.4 fL (ref 7.5–12.5)
Monocytes Relative: 6.8 %
Neutro Abs: 4125 cells/uL (ref 1500–7800)
Neutrophils Relative %: 62.5 %
Platelets: 210 10*3/uL (ref 140–400)
RBC: 5.3 10*6/uL (ref 4.20–5.80)
RDW: 12.1 % (ref 11.0–15.0)
Total Lymphocyte: 26.7 %
WBC: 6.6 10*3/uL (ref 3.8–10.8)

## 2020-06-23 LAB — COMPLETE METABOLIC PANEL WITH GFR
AG Ratio: 2.2 (calc) (ref 1.0–2.5)
ALT: 27 U/L (ref 9–46)
AST: 21 U/L (ref 10–40)
Albumin: 4.9 g/dL (ref 3.6–5.1)
Alkaline phosphatase (APISO): 72 U/L (ref 36–130)
BUN: 13 mg/dL (ref 7–25)
CO2: 21 mmol/L (ref 20–32)
Calcium: 9.7 mg/dL (ref 8.6–10.3)
Chloride: 105 mmol/L (ref 98–110)
Creat: 0.91 mg/dL (ref 0.60–1.35)
GFR, Est African American: 118 mL/min/{1.73_m2} (ref >=60)
GFR, Est Non African American: 102 mL/min/{1.73_m2} (ref >=60)
Globulin: 2.2 g/dL (calc) (ref 1.9–3.7)
Glucose, Bld: 88 mg/dL (ref 65–99)
Potassium: 4.4 mmol/L (ref 3.5–5.3)
Sodium: 141 mmol/L (ref 135–146)
Total Bilirubin: 0.8 mg/dL (ref 0.2–1.2)
Total Protein: 7.1 g/dL (ref 6.1–8.1)

## 2020-06-23 LAB — URIC ACID: Uric Acid, Serum: 4.4 mg/dL (ref 4.0–8.0)

## 2020-09-16 ENCOUNTER — Other Ambulatory Visit: Payer: Self-pay | Admitting: *Deleted

## 2020-09-16 MED ORDER — MONTELUKAST SODIUM 10 MG PO TABS: 10.0000 mg | ORAL_TABLET | Freq: Every day | ORAL | 3 refills | Status: DC

## 2020-09-22 ENCOUNTER — Ambulatory Visit: Payer: Self-pay | Admitting: Family Medicine

## 2020-09-30 ENCOUNTER — Other Ambulatory Visit: Payer: Self-pay | Admitting: *Deleted

## 2020-09-30 MED ORDER — COLCHICINE 0.6 MG PO CAPS: 0.6000 mg | ORAL_CAPSULE | Freq: Every day | ORAL | 3 refills | Status: DC

## 2020-10-06 ENCOUNTER — Encounter: Payer: Self-pay | Admitting: Family Medicine

## 2020-10-06 ENCOUNTER — Other Ambulatory Visit: Payer: Self-pay | Admitting: Family Medicine

## 2020-10-07 NOTE — Telephone Encounter (Signed)
Ok to refill??  Last office visit 06/22/2020.  Last refill 06/15/2020, #3 refills.

## 2020-10-08 MED ORDER — ZOLPIDEM TARTRATE 10 MG PO TABS: 10.0000 mg | ORAL_TABLET | Freq: Every evening | ORAL | 3 refills | Status: DC | PRN

## 2021-02-03 ENCOUNTER — Encounter: Payer: Self-pay | Admitting: Family Medicine

## 2021-02-04 ENCOUNTER — Other Ambulatory Visit: Payer: Self-pay | Admitting: Family Medicine

## 2021-02-04 MED ORDER — ZOLPIDEM TARTRATE 10 MG PO TABS: 10.0000 mg | ORAL_TABLET | Freq: Every evening | ORAL | 3 refills | Status: DC | PRN

## 2021-02-04 NOTE — Telephone Encounter (Signed)
Please advise on refill request. Thank you

## 2021-03-29 ENCOUNTER — Other Ambulatory Visit: Payer: Self-pay | Admitting: Family Medicine

## 2021-04-20 ENCOUNTER — Other Ambulatory Visit: Payer: Self-pay | Admitting: Family Medicine

## 2021-04-20 ENCOUNTER — Other Ambulatory Visit: Payer: Self-pay

## 2021-04-20 ENCOUNTER — Ambulatory Visit: Payer: BC Managed Care – PPO | Admitting: Family Medicine

## 2021-04-20 ENCOUNTER — Encounter: Payer: Self-pay | Admitting: Family Medicine

## 2021-04-20 VITALS — BP 128/78 | HR 89 | Temp 97.7°F | Resp 18 | Ht 67.0 in | Wt 237.0 lb

## 2021-04-20 DIAGNOSIS — K429 Umbilical hernia without obstruction or gangrene: Secondary | ICD-10-CM

## 2021-04-20 DIAGNOSIS — M1A00X0 Idiopathic chronic gout, unspecified site, without tophus (tophi): Secondary | ICD-10-CM | POA: Diagnosis not present

## 2021-04-20 DIAGNOSIS — F902 Attention-deficit hyperactivity disorder, combined type: Secondary | ICD-10-CM

## 2021-04-20 MED ORDER — ALLOPURINOL 300 MG PO TABS: 300.0000 mg | ORAL_TABLET | Freq: Every day | ORAL | 6 refills | Status: DC

## 2021-04-20 MED ORDER — COLCHICINE 0.6 MG PO CAPS: 0.6000 mg | ORAL_CAPSULE | Freq: Every day | ORAL | 3 refills | Status: DC

## 2021-04-20 MED ORDER — AMPHETAMINE-DEXTROAMPHETAMINE 10 MG PO TABS: 10.0000 mg | ORAL_TABLET | Freq: Two times a day (BID) | ORAL | 0 refills | Status: DC

## 2021-04-20 MED ORDER — COLCHICINE 0.6 MG PO TABS: 0.6000 mg | ORAL_TABLET | Freq: Every day | ORAL | 3 refills | Status: DC

## 2021-04-20 NOTE — Progress Notes (Signed)
Subjective:    Patient ID: Peter Whitehead, male    DOB: December 11, 1975, 46 y.o.   MRN: [id number]  HPI  Patient presents today with 2 concerns.  First, he has been having more trouble with gout.  He is having to take colchicine on a daily basis due to pain in his feet.  Even though he is on allopurinol 200 mg a day, if he does not take the colchicine he experiences pain in his feet.  He has also tried different versions of colchicine.  Tablets do not work for him.  Despite taking the tablets he will still have gout pain particular in his MTP joint.  The capsules were better.  However, we need to complete a prior authorization apparently for the capsules per his insurance.  Second issue is ADD.  He has longstanding history of ADD.  He has trouble focusing.  Even during our conversation today, I can tell that he is drifting off and points while on trying to explain his treatment options.  He states that this happens to him all the time at work.  He also has a difficult time completing tasks on a timely basis.  He has to reread material multiple times in order to commit it to memory.  He finds himself easily distracted.  He has a problem with hyperactivity and impulse control.  In the past he saw psychiatrist and took Adderall 30 mg twice daily.  He states that that was entirely 2 months.  He saw benefit at lower doses.  His son has been battling ADD and the patient is interested in resuming his medication because he sees the benefit that his son has received from the medicine.  Third he would like a referral to a general surgeon to address the umbilical hernia that he has Past Medical History:  Diagnosis Date   ADD (attention deficit disorder)    treated during childhood   Gout    Hernia, umbilical    Metabolic syndrome    Past Surgical History:  Procedure Laterality Date   pyloric stenosis     Current Outpatient Medications on File Prior to Visit  Medication Sig Dispense Refill    allopurinol (ZYLOPRIM) 100 MG tablet TAKE 2 TABLETS BY MOUTH EVERY DAY 180 tablet 3   Colchicine (MITIGARE) 0.6 MG CAPS Take 0.6 mg by mouth daily. 90 capsule 3   FIBER ADULT GUMMIES PO Take by mouth.     fluticasone (FLONASE) 50 MCG/ACT nasal spray Place into both nostrils daily.     levocetirizine (XYZAL) 5 MG tablet Take 5 mg by mouth every evening.     Misc Natural Products (TART CHERRY ADVANCED) CAPS Take by mouth.     montelukast (SINGULAIR) 10 MG tablet Take 1 tablet (10 mg total) by mouth at bedtime. 90 tablet 3   naproxen sodium (ALEVE) 220 MG tablet Take 220 mg by mouth.     zolpidem (AMBIEN) 10 MG tablet Take 1 tablet (10 mg total) by mouth at bedtime as needed. for sleep 30 tablet 3   No current facility-administered medications on file prior to visit.   Allergies  Allergen Reactions   Hydrocodone     Irritability    Social History   Socioeconomic History   Marital status: Legally Separated    Spouse name: Not on file   Number of children: Not on file   Years of education: Not on file   Highest education level: Not on file  Occupational History   Not  on file  Tobacco Use   Smoking status: Former   Smokeless tobacco: Never  Vaping Use   Vaping Use: Never used  Substance and Sexual Activity   Alcohol use: Yes    Alcohol/week: 0.0 standard drinks    Comment: Occasional   Drug use: No   Sexual activity: Not on file  Other Topics Concern   Not on file  Social History Narrative   Not on file   Social Determinants of Health   Financial Resource Strain: Not on file  Food Insecurity: Not on file  Transportation Needs: Not on file  Physical Activity: Not on file  Stress: Not on file  Social Connections: Not on file  Intimate Partner Violence: Not on file   Family History  Problem Relation Age of Onset   Arthritis Mother        rheumatoid   Heart disease Father    Hyperlipidemia Father    Hypertension Father    AAA (abdominal aortic aneurysm) Father        Review of Systems  All other systems reviewed and are negative.     Objective:   Physical Exam Vitals reviewed.  Constitutional:      General: He is not in acute distress.    Appearance: Normal appearance. He is not ill-appearing, toxic-appearing or diaphoretic.  HENT:     Head: Normocephalic and atraumatic.     Nose: No congestion or rhinorrhea.  Neck:     Vascular: No carotid bruit.  Cardiovascular:     Rate and Rhythm: Normal rate and regular rhythm.     Pulses: Normal pulses.     Heart sounds: Normal heart sounds. No murmur heard.   No friction rub. No gallop.  Pulmonary:     Effort: Pulmonary effort is normal. No respiratory distress.     Breath sounds: Normal breath sounds. No stridor. No wheezing, rhonchi or rales.  Chest:     Chest wall: No tenderness.  Abdominal:     General: Abdomen is flat. Bowel sounds are normal. There is no distension.     Palpations: Abdomen is soft. There is no mass.     Tenderness: There is no abdominal tenderness. There is no right CVA tenderness, left CVA tenderness, guarding or rebound.     Hernia: A hernia is present.  Musculoskeletal:     Cervical back: Neck supple. No rigidity or tenderness.  Lymphadenopathy:     Cervical: No cervical adenopathy.  Neurological:     Mental Status: He is alert.          Assessment & Plan:  Umbilical hernia without obstruction and without gangrene - Plan: Ambulatory referral to General Surgery  Idiopathic chronic gout without tophus, unspecified site  Attention deficit hyperactivity disorder (ADHD), combined type First, regarding his gout, I explained the role of allopurinol to lower uric acid level of the blood.  The hope of this is that he would not need to take colchicine on a daily basis once the uric acid level has stabilized.  I would like the patient to increase his allopurinol to 300 mg a day.  Continue colchicine 0.6 mg daily for the first 3 months and then discontinue the  medication and only use colchicine on an as-needed basis after that point.  I will try to get the capsules approved for him as the tablets have not worked as well.  Secondly, I will start the patient on Adderall 10 mg twice daily.  I will recheck with the patient  electronically in 1 month to see how he is doing on that dose.  He will monitor his blood pressure and his heart rate.  Third, I will refer the patient to general surgery to discuss repair of his umbilical hernia

## 2021-04-20 NOTE — Telephone Encounter (Signed)
PA for Mitigare has been APPROVED [id number]  04/20/21-04/20/22  Pt aware.

## 2021-04-22 ENCOUNTER — Other Ambulatory Visit: Payer: Self-pay

## 2021-04-22 DIAGNOSIS — K429 Umbilical hernia without obstruction or gangrene: Secondary | ICD-10-CM

## 2021-04-23 MED ORDER — COLCHICINE 0.6 MG PO CAPS: 1.0000 | ORAL_CAPSULE | Freq: Every day | ORAL | 3 refills | Status: AC

## 2021-04-23 NOTE — Addendum Note (Signed)
Addended by: Shelby Dubin on: 04/23/2021 01:55 PM   Modules accepted: Orders

## 2021-05-20 ENCOUNTER — Other Ambulatory Visit: Payer: Self-pay | Admitting: Family Medicine

## 2021-05-21 MED ORDER — AMPHETAMINE-DEXTROAMPHETAMINE 10 MG PO TABS: 10.0000 mg | ORAL_TABLET | Freq: Two times a day (BID) | ORAL | 0 refills | Status: DC

## 2021-05-21 NOTE — Telephone Encounter (Signed)
LOV 04/20/21 Last refill 1/17/, #60, 0 refills  Please review, thanks!

## 2021-06-01 ENCOUNTER — Encounter: Payer: Self-pay | Admitting: Family Medicine

## 2021-06-01 MED ORDER — ZOLPIDEM TARTRATE 10 MG PO TABS: 10.0000 mg | ORAL_TABLET | Freq: Every evening | ORAL | 3 refills | Status: DC | PRN

## 2021-06-01 NOTE — Telephone Encounter (Signed)
LOV 04/20/21 Last refill 02/04/21, #30, 3 refills  Please review, thanks!

## 2021-06-21 ENCOUNTER — Other Ambulatory Visit: Payer: Self-pay | Admitting: Family Medicine

## 2021-06-22 MED ORDER — AMPHETAMINE-DEXTROAMPHETAMINE 10 MG PO TABS: 10.0000 mg | ORAL_TABLET | Freq: Two times a day (BID) | ORAL | 0 refills | Status: DC

## 2021-06-22 NOTE — Telephone Encounter (Signed)
Duplicate request

## 2021-06-22 NOTE — Telephone Encounter (Signed)
LOV 04/20/21 ?Last refill 05/21/21, #10mg , 0 refills ? ?Please review, thanks!  ?

## 2021-07-15 HISTORY — PX: HERNIA REPAIR: SHX51

## 2021-07-30 ENCOUNTER — Encounter: Payer: Self-pay | Admitting: Family Medicine

## 2021-07-30 MED ORDER — AMPHETAMINE-DEXTROAMPHETAMINE 10 MG PO TABS: 10.0000 mg | ORAL_TABLET | Freq: Two times a day (BID) | ORAL | 0 refills | Status: DC

## 2021-07-30 NOTE — Telephone Encounter (Signed)
LOV 04/20/21 ?Last refill 06/22/21, #60, 0 refills ? ?Please review, thanks! ? ? ?

## 2021-09-03 ENCOUNTER — Ambulatory Visit: Payer: BC Managed Care – PPO | Admitting: Family Medicine

## 2021-09-07 ENCOUNTER — Other Ambulatory Visit: Payer: Self-pay | Admitting: Family Medicine

## 2021-09-12 MED ORDER — AMPHETAMINE-DEXTROAMPHETAMINE 10 MG PO TABS: 10.0000 mg | ORAL_TABLET | Freq: Two times a day (BID) | ORAL | 0 refills | Status: DC

## 2021-09-17 ENCOUNTER — Ambulatory Visit (INDEPENDENT_AMBULATORY_CARE_PROVIDER_SITE_OTHER): Payer: BC Managed Care – PPO | Admitting: Family Medicine

## 2021-09-17 ENCOUNTER — Encounter: Payer: Self-pay | Admitting: Family Medicine

## 2021-09-17 VITALS — BP 124/90 | HR 73 | Temp 98.6°F | Ht 67.0 in | Wt 241.6 lb

## 2021-09-17 DIAGNOSIS — Z0000 Encounter for general adult medical examination without abnormal findings: Secondary | ICD-10-CM

## 2021-09-17 DIAGNOSIS — M1A00X0 Idiopathic chronic gout, unspecified site, without tophus (tophi): Secondary | ICD-10-CM | POA: Diagnosis not present

## 2021-09-17 DIAGNOSIS — F902 Attention-deficit hyperactivity disorder, combined type: Secondary | ICD-10-CM | POA: Diagnosis not present

## 2021-09-17 DIAGNOSIS — Z1211 Encounter for screening for malignant neoplasm of colon: Secondary | ICD-10-CM

## 2021-09-17 DIAGNOSIS — E8881 Metabolic syndrome: Secondary | ICD-10-CM | POA: Diagnosis not present

## 2021-09-17 NOTE — Progress Notes (Signed)
Subjective:    Patient ID: Peter Whitehead, male    DOB: 18-Apr-1975, 46 y.o.   MRN: [id number]  HPI Patient is here today for a complete physical exam.  He denies any medical concerns.  Since I last saw the patient, he had an umbilical hernia repair.  He gained weight after the umbilical hernia repair however he started exercising again and he wants to try to lose some weight.  Unfortunately since I last saw him, his father was diagnosed with renal cell carcinoma.  His father also has congestive heart failure, history of AAA, and his father recently had a hemorrhagic stroke.  His father is currently in the hospital in the ICU on a ventilator after the hemorrhagic stroke.  Obviously the patient is upset about this and this could be contributing some to his elevated blood pressure.  Aside from that he is doing relatively well.  He denies any other concerns.  He is due for colon cancer screening. Past Medical History:  Diagnosis Date   ADD (attention deficit disorder)    treated during childhood   Gout    Hernia, umbilical    Metabolic syndrome    Past Surgical History:  Procedure Laterality Date   pyloric stenosis     Current Outpatient Medications on File Prior to Visit  Medication Sig Dispense Refill   allopurinol (ZYLOPRIM) 300 MG tablet Take 1 tablet (300 mg total) by mouth daily. 30 tablet 6   amphetamine-dextroamphetamine (ADDERALL) 10 MG tablet Take 1 tablet (10 mg total) by mouth 2 (two) times daily. 60 tablet 0   Colchicine (MITIGARE) 0.6 MG CAPS Take 1 tablet by mouth daily. 90 capsule 3   FIBER ADULT GUMMIES PO Take by mouth.     fluticasone (FLONASE) 50 MCG/ACT nasal spray Place into both nostrils daily. (Patient not taking: Reported on 04/20/2021)     levocetirizine (XYZAL) 5 MG tablet Take 5 mg by mouth every evening. (Patient not taking: Reported on 04/20/2021)     Misc Natural Products (TART CHERRY ADVANCED) CAPS Take by mouth.     montelukast (SINGULAIR) 10 MG tablet  Take 1 tablet (10 mg total) by mouth at bedtime. 90 tablet 3   naproxen sodium (ALEVE) 220 MG tablet Take 220 mg by mouth.     TART CHERRY PO Take by mouth.     zolpidem (AMBIEN) 10 MG tablet Take 1 tablet (10 mg total) by mouth at bedtime as needed. for sleep 30 tablet 3   No current facility-administered medications on file prior to visit.   Allergies  Allergen Reactions   Hydrocodone     Irritability    Social History   Socioeconomic History   Marital status: Divorced    Spouse name: Not on file   Number of children: Not on file   Years of education: Not on file   Highest education level: Not on file  Occupational History   Not on file  Tobacco Use   Smoking status: Former   Smokeless tobacco: Never  Vaping Use   Vaping Use: Never used  Substance and Sexual Activity   Alcohol use: Yes    Alcohol/week: 0.0 standard drinks of alcohol    Comment: Occasional   Drug use: No   Sexual activity: Not on file  Other Topics Concern   Not on file  Social History Narrative   Not on file   Social Determinants of Health   Financial Resource Strain: Not on file  Food Insecurity: Not on  file  Transportation Needs: Not on file  Physical Activity: Not on file  Stress: Not on file  Social Connections: Not on file  Intimate Partner Violence: Not on file   Family History  Problem Relation Age of Onset   Arthritis Mother        rheumatoid   Heart disease Father    Hyperlipidemia Father    Hypertension Father    AAA (abdominal aortic aneurysm) Father       Review of Systems  All other systems reviewed and are negative.      Objective:   Physical Exam Vitals reviewed.  Constitutional:      General: He is not in acute distress.    Appearance: Normal appearance. He is not ill-appearing, toxic-appearing or diaphoretic.  HENT:     Head: Normocephalic and atraumatic.     Right Ear: Tympanic membrane, ear canal and external ear normal.     Left Ear: Tympanic membrane,  ear canal and external ear normal.     Nose: Nose normal. No congestion or rhinorrhea.     Mouth/Throat:     Mouth: Mucous membranes are moist.     Pharynx: Oropharynx is clear. No oropharyngeal exudate or posterior oropharyngeal erythema.  Eyes:     General: No scleral icterus.       Right eye: No discharge.        Left eye: No discharge.     Extraocular Movements: Extraocular movements intact.     Conjunctiva/sclera: Conjunctivae normal.     Pupils: Pupils are equal, round, and reactive to light.  Neck:     Vascular: No carotid bruit.  Cardiovascular:     Rate and Rhythm: Normal rate and regular rhythm.     Pulses: Normal pulses.     Heart sounds: Normal heart sounds. No murmur heard.    No friction rub. No gallop.  Pulmonary:     Effort: Pulmonary effort is normal. No respiratory distress.     Breath sounds: Normal breath sounds. No stridor. No wheezing, rhonchi or rales.  Chest:     Chest wall: No tenderness.  Abdominal:     General: Abdomen is flat. Bowel sounds are normal. There is no distension.     Palpations: Abdomen is soft. There is no mass.     Tenderness: There is no abdominal tenderness. There is no right CVA tenderness, left CVA tenderness, guarding or rebound.     Hernia: No hernia is present.  Musculoskeletal:        General: No signs of injury. Normal range of motion.     Cervical back: Neck supple. No rigidity or tenderness.     Right lower leg: No edema.     Left lower leg: No edema.  Lymphadenopathy:     Cervical: No cervical adenopathy.  Skin:    General: Skin is warm.     Coloration: Skin is not jaundiced or pale.     Findings: No bruising, erythema, lesion or rash.  Neurological:     General: No focal deficit present.     Mental Status: He is alert and oriented to person, place, and time. Mental status is at baseline.     Cranial Nerves: No cranial nerve deficit.     Sensory: No sensory deficit.     Motor: No weakness.     Coordination:  Coordination normal.     Gait: Gait normal.     Deep Tendon Reflexes: Reflexes normal.  Psychiatric:  Mood and Affect: Mood normal.        Behavior: Behavior normal.        Thought Content: Thought content normal.        Judgment: Judgment normal.           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  There is no palpable bruit or mass today on his abdominal exam.  I will check a CBC, CMP, lipid panel.  Given his history of metabolic syndrome and his recent weight gain I will also check an A1c.  Given his history of gout, I will check a uric acid.  His blood pressure today is borderline elevated.  Of asked him to check it at home and notify me of the values in a couple weeks.  We need to focus on controlling his blood pressure as the majority of his father's medical condition stem from high blood pressure.  He is also due for colon cancer screening.  We discussed Cologuard versus a colonoscopy and the patient elects to do a Cologuard test.  He is due for a tetanus shot but he declines that today

## 2021-09-18 LAB — URIC ACID: Uric Acid, Serum: 4.1 mg/dL (ref 4.0–8.0)

## 2021-09-18 LAB — COMPLETE METABOLIC PANEL WITH GFR
AG Ratio: 2 (calc) (ref 1.0–2.5)
ALT: 33 U/L (ref 9–46)
AST: 22 U/L (ref 10–40)
Albumin: 4.9 g/dL (ref 3.6–5.1)
Alkaline phosphatase (APISO): 78 U/L (ref 36–130)
BUN: 12 mg/dL (ref 7–25)
CO2: 21 mmol/L (ref 20–32)
Calcium: 9.7 mg/dL (ref 8.6–10.3)
Chloride: 105 mmol/L (ref 98–110)
Creat: 0.82 mg/dL (ref 0.60–1.29)
Globulin: 2.5 g/dL (calc) (ref 1.9–3.7)
Glucose, Bld: 87 mg/dL (ref 65–99)
Potassium: 4.1 mmol/L (ref 3.5–5.3)
Sodium: 139 mmol/L (ref 135–146)
Total Bilirubin: 1.1 mg/dL (ref 0.2–1.2)
Total Protein: 7.4 g/dL (ref 6.1–8.1)
eGFR: 110 mL/min/{1.73_m2} (ref >=60)

## 2021-09-18 LAB — CBC WITH DIFFERENTIAL/PLATELET
Absolute Monocytes: 554 cells/uL (ref 200–950)
Basophils Absolute: 70 cells/uL (ref 0–200)
Basophils Relative: 0.9 %
Eosinophils Absolute: 164 cells/uL (ref 15–500)
Eosinophils Relative: 2.1 %
HCT: 47.7 % (ref 38.5–50.0)
Hemoglobin: 16.9 g/dL (ref 13.2–17.1)
Lymphs Abs: 2285 cells/uL (ref 850–3900)
MCH: 31.3 pg (ref 27.0–33.0)
MCHC: 35.4 g/dL (ref 32.0–36.0)
MCV: 88.3 fL (ref 80.0–100.0)
MPV: 11 fL (ref 7.5–12.5)
Monocytes Relative: 7.1 %
Neutro Abs: 4727 cells/uL (ref 1500–7800)
Neutrophils Relative %: 60.6 %
Platelets: 237 10*3/uL (ref 140–400)
RBC: 5.4 10*6/uL (ref 4.20–5.80)
RDW: 12.2 % (ref 11.0–15.0)
Total Lymphocyte: 29.3 %
WBC: 7.8 10*3/uL (ref 3.8–10.8)

## 2021-09-18 LAB — HEMOGLOBIN A1C
Hgb A1c MFr Bld: 5.2 % of total Hgb (ref <5.7)
Mean Plasma Glucose: 103 mg/dL
eAG (mmol/L): 5.7 mmol/L

## 2021-09-18 LAB — LIPID PANEL
Cholesterol: 188 mg/dL (ref <200)
HDL: 41 mg/dL (ref >=40)
LDL Cholesterol (Calc): 120 mg/dL (calc) — ABNORMAL HIGH
Non-HDL Cholesterol (Calc): 147 mg/dL (calc) — ABNORMAL HIGH (ref <130)
Total CHOL/HDL Ratio: 4.6 (calc) (ref <5.0)
Triglycerides: 152 mg/dL — ABNORMAL HIGH (ref <150)

## 2021-09-28 ENCOUNTER — Other Ambulatory Visit: Payer: Self-pay | Admitting: Family Medicine

## 2021-09-29 NOTE — Telephone Encounter (Signed)
Requested medication (s) are due for refill today: yes  Requested medication (s) are on the active medication list: yes  Last refill:  06/01/21  Future visit scheduled: no  Notes to clinic:  Unable to refill per protocol, cannot delegate.      Requested Prescriptions  Pending Prescriptions Disp Refills   zolpidem (AMBIEN) 10 MG tablet [Pharmacy Med Name: ZOLPIDEM TARTRATE 10 MG TABLET] 30 tablet 3    Sig: Take 1 tablet (10 mg total) by mouth at bedtime as needed. for sleep     Not Delegated - Psychiatry:  Anxiolytics/Hypnotics Failed - 09/28/2021 10:39 PM      Failed - This refill cannot be delegated      Failed - Urine Drug Screen completed in last 360 days      Passed - Valid encounter within last 6 months    Recent Outpatient Visits           5 months ago Umbilical hernia without obstruction and without gangrene   [facility] Pickard, Priscille Heidelberg, MD   1 year ago Metabolic syndrome   [facility] Tanya Nones, Priscille Heidelberg, MD   2 years ago General medical exam   [facility] Donita Brooks, MD   2 years ago Right foot pain   [facility] [place], Velna Hatchet, MD   6 years ago Acute rhinosinusitis   [facility] Pickard, Priscille Heidelberg, MD

## 2021-09-30 ENCOUNTER — Encounter: Payer: Self-pay | Admitting: Family Medicine

## 2021-10-01 ENCOUNTER — Other Ambulatory Visit: Payer: Self-pay | Admitting: Family Medicine

## 2021-10-01 MED ORDER — ZOLPIDEM TARTRATE 10 MG PO TABS: 10.0000 mg | ORAL_TABLET | Freq: Every evening | ORAL | 3 refills | Status: DC | PRN

## 2021-10-01 NOTE — Telephone Encounter (Signed)
Requested Prescriptions  Pending Prescriptions Disp Refills  . allopurinol (ZYLOPRIM) 300 MG tablet [Pharmacy Med Name: ALLOPURINOL 300 MG TABLET] 90 tablet 3    Sig: TAKE 1 TABLET BY MOUTH EVERY DAY     Endocrinology:  Gout Agents - allopurinol Passed - 10/01/2021  2:09 AM      Passed - Uric Acid in normal range and within 360 days    Uric Acid, Serum  Date Value Ref Range Status  09/17/2021 4.1 4.0 - 8.0 mg/dL Final    Comment:    Therapeutic target for gout patients: <6.0 mg/dL .          Passed - Cr in normal range and within 360 days    Creat  Date Value Ref Range Status  09/17/2021 0.82 0.60 - 1.29 mg/dL Final         Passed - Valid encounter within last 12 months    Recent Outpatient Visits          5 months ago Umbilical hernia without obstruction and without gangrene   [facility] Pickard, Priscille Heidelberg, MD   1 year ago Metabolic syndrome   [facility] Pickard, Priscille Heidelberg, MD   2 years ago General medical exam   [facility] Donita Brooks, MD   2 years ago Right foot pain   [facility] [place], Velna Hatchet, MD   6 years ago Acute rhinosinusitis   [facility] Pickard, Priscille Heidelberg, MD             Passed - CBC within normal limits and completed in the last 12 months    WBC  Date Value Ref Range Status  09/17/2021 7.8 3.8 - 10.8 Thousand/uL Final   RBC  Date Value Ref Range Status  09/17/2021 5.40 4.20 - 5.80 Million/uL Final   Hemoglobin  Date Value Ref Range Status  09/17/2021 16.9 13.2 - 17.1 g/dL Final   HCT  Date Value Ref Range Status  09/17/2021 47.7 38.5 - 50.0 % Final   MCHC  Date Value Ref Range Status  09/17/2021 35.4 32.0 - 36.0 g/dL Final   [facility]  Date Value Ref Range Status  09/17/2021 31.3 27.0 - 33.0 pg Final   MCV  Date Value Ref Range Status  09/17/2021 88.3 80.0 - 100.0 fL Final   No results found for: "PLTCOUNTKUC", "LABPLAT", "POCPLA" RDW  Date  Value Ref Range Status  09/17/2021 12.2 11.0 - 15.0 % Final

## 2021-10-01 NOTE — Telephone Encounter (Signed)
LOV 09/17/21 Last refill 06/01/21, #30, 0 refills  Please review, thanks!

## 2021-10-19 ENCOUNTER — Other Ambulatory Visit: Payer: Self-pay | Admitting: Family Medicine

## 2021-10-19 MED ORDER — AMPHETAMINE-DEXTROAMPHETAMINE 10 MG PO TABS: 10.0000 mg | ORAL_TABLET | Freq: Two times a day (BID) | ORAL | 0 refills | Status: DC

## 2021-10-19 NOTE — Telephone Encounter (Signed)
LOV 09/17/21 Last refill 09/12/21, #60, 0 refills  Please review, thanks!

## 2021-11-17 ENCOUNTER — Other Ambulatory Visit: Payer: Self-pay | Admitting: Family Medicine

## 2021-11-19 MED ORDER — AMPHETAMINE-DEXTROAMPHETAMINE 10 MG PO TABS: 10.0000 mg | ORAL_TABLET | Freq: Two times a day (BID) | ORAL | 0 refills | Status: DC

## 2021-12-19 ENCOUNTER — Other Ambulatory Visit: Payer: Self-pay | Admitting: Family Medicine

## 2021-12-21 MED ORDER — AMPHETAMINE-DEXTROAMPHETAMINE 10 MG PO TABS: 10.0000 mg | ORAL_TABLET | Freq: Two times a day (BID) | ORAL | 0 refills | Status: DC

## 2021-12-30 ENCOUNTER — Ambulatory Visit (INDEPENDENT_AMBULATORY_CARE_PROVIDER_SITE_OTHER): Payer: Worker's Compensation | Admitting: Family Medicine

## 2021-12-30 VITALS — BP 140/70 | HR 90 | Temp 98.6°F | Ht 67.0 in | Wt 237.0 lb

## 2021-12-30 DIAGNOSIS — W57XXXA Bitten or stung by nonvenomous insect and other nonvenomous arthropods, initial encounter: Secondary | ICD-10-CM

## 2021-12-30 DIAGNOSIS — S80862A Insect bite (nonvenomous), left lower leg, initial encounter: Secondary | ICD-10-CM | POA: Diagnosis not present

## 2021-12-30 DIAGNOSIS — Z9189 Other specified personal risk factors, not elsewhere classified: Secondary | ICD-10-CM | POA: Diagnosis not present

## 2021-12-30 MED ORDER — DOXYCYCLINE HYCLATE 100 MG PO TABS: 100.0000 mg | ORAL_TABLET | Freq: Two times a day (BID) | ORAL | 0 refills | Status: AC

## 2021-12-30 NOTE — Progress Notes (Signed)
   Acute Office Visit  Subjective:     Patient ID: Peter Whitehead, male    DOB: 02-27-1976, 46 y.o.   MRN: [id number]  Chief Complaint  Patient presents with   Follow-up    Possible infected tick bite    Peter Whitehead presents today with concerns about a tick bite that occurred on September 13. He was able to pull off the tick but is unsure how long it was attached and unsure if the head was attached. Since then the site has become red, inflamed, itchy, and is getting worse and spreading. Associated symptoms include body aches, headache, fatigue, and irritability. He denies nausea/vomiting, generalized rash, neck stiffness, shortness of breath, or palpitations or chest pain. He has tried hydrocortisone cream with no relief.    Review of Systems  Constitutional:  Positive for malaise/fatigue.  Respiratory:  Negative for shortness of breath.   Cardiovascular:  Negative for chest pain and palpitations.  Gastrointestinal:  Negative for nausea and vomiting.  Musculoskeletal:  Positive for myalgias.  Skin:  Positive for rash (left calf).  Neurological:  Positive for headaches.        Objective:    BP (!) 140/70   Pulse 90   Temp 98.6 F (37 C) (Oral)   Ht 5\' 7"  (1.702 m)   Wt 237 lb (107.5 kg)   SpO2 96%   BMI 37.12 kg/m    Physical Exam Constitutional:      Appearance: Normal appearance.  Skin:    General: Skin is warm and dry.          Comments: Round, erythematous nodule with surrounding maculopapular rash  Neurological:     General: No focal deficit present.     Mental Status: He is alert and oriented to person, place, and time.  Psychiatric:        Mood and Affect: Mood normal.        Behavior: Behavior normal.        Thought Content: Thought content normal.        Judgment: Judgment normal.     No results found for any visits on 12/30/21.      Assessment & Plan:   Tick bite of left lower leg, initial encounter - Plan: B. burgdorfi antibodies by  WB, [place] spotted fvr abs pnl(IgG+IgM) I am concerned Peter Whitehead has early stage Lyme disease given his spreading erythema migrans as well as systemic symptoms. Testing for Lyme and RMSF today and will begin treatment with Doxycycline 100mg  BID for 21 days. Instructed to call back if symptoms worsen or change.  Return if symptoms worsen or fail to improve.  Peter Maid, FNP

## 2021-12-31 ENCOUNTER — Telehealth: Payer: Self-pay | Admitting: Family Medicine

## 2021-12-31 NOTE — Telephone Encounter (Signed)
Called Peter Whitehead back to give her the direct contact numbers of our billing dept, and asked her to contact them directly; no answer.  Left message on her vmail.

## 2021-12-31 NOTE — Telephone Encounter (Signed)
Received voicemail message from Wess Botts from [facility] re:   claim #: [id number]  Date of injury: 12/15/2021  Billing info: [address], [place],  [place] [address]  Fax #: Fx: [phone]  If faxing any correspondence, please write claim # on each page.  Outbound call placed to acknowledge message. No answer; left message.  For additional information, please advise Levada Dy at ph: [phone] and give claim #

## 2022-01-03 ENCOUNTER — Encounter: Payer: Self-pay | Admitting: Family Medicine

## 2022-01-04 ENCOUNTER — Telehealth: Payer: Self-pay | Admitting: Family Medicine

## 2022-01-04 LAB — B. BURGDORFI ANTIBODIES BY WB
B burgdorferi IgG Abs (IB): NEGATIVE
B burgdorferi IgM Abs (IB): NEGATIVE
Lyme Disease 18 kD IgG: NONREACTIVE
Lyme Disease 23 kD IgG: NONREACTIVE
Lyme Disease 23 kD IgM: NONREACTIVE
Lyme Disease 28 kD IgG: NONREACTIVE
Lyme Disease 30 kD IgG: NONREACTIVE
Lyme Disease 39 kD IgG: NONREACTIVE
Lyme Disease 39 kD IgM: NONREACTIVE
Lyme Disease 41 kD IgG: NONREACTIVE
Lyme Disease 41 kD IgM: NONREACTIVE
Lyme Disease 45 kD IgG: NONREACTIVE
Lyme Disease 58 kD IgG: REACTIVE — AB
Lyme Disease 66 kD IgG: NONREACTIVE
Lyme Disease 93 kD IgG: NONREACTIVE

## 2022-01-04 LAB — ROCKY MTN SPOTTED FVR ABS PNL(IGG+IGM)
RMSF IgG: NOT DETECTED
RMSF IgM: NOT DETECTED

## 2022-01-04 NOTE — Telephone Encounter (Signed)
Called Mr Beichner to report negative RMSF and schedule an appointment to be seen for shortness of breath, no answer, message left and will have CMA return call.

## 2022-01-20 ENCOUNTER — Encounter: Payer: Self-pay | Admitting: Family Medicine

## 2022-01-20 ENCOUNTER — Ambulatory Visit (INDEPENDENT_AMBULATORY_CARE_PROVIDER_SITE_OTHER): Payer: Worker's Compensation | Admitting: Family Medicine

## 2022-01-20 VITALS — BP 142/82 | HR 80 | Temp 98.9°F | Ht 67.0 in | Wt 241.0 lb

## 2022-01-20 DIAGNOSIS — S80862D Insect bite (nonvenomous), left lower leg, subsequent encounter: Secondary | ICD-10-CM

## 2022-01-20 DIAGNOSIS — W57XXXD Bitten or stung by nonvenomous insect and other nonvenomous arthropods, subsequent encounter: Secondary | ICD-10-CM | POA: Diagnosis not present

## 2022-01-20 NOTE — Progress Notes (Signed)
   Acute Office Visit  Subjective:     Patient ID: Peter Whitehead, male    DOB: 12/08/1975, 46 y.o.   MRN: [id number]  Chief Complaint  Patient presents with   Follow-up    F/u tick bite    HPI Patient is in today for follow-up for recent tick bite and Lyme treatment. His course of antibiotics is complete today. His symptoms have mostly resolved besides some residual fatigue.  Review of Systems  Constitutional: Negative.   Respiratory: Negative.    Cardiovascular: Negative.   Skin: Negative.   Neurological: Negative.   All other systems reviewed and are negative.       Objective:    BP (!) 142/82   Pulse 80   Temp 98.9 F (37.2 C) (Oral)   Ht 5\' 7"  (1.702 m)   Wt 241 lb (109.3 kg)   SpO2 98%   BMI 37.75 kg/m    Physical Exam Vitals and nursing note reviewed.  Constitutional:      Appearance: Normal appearance.  Skin:    General: Skin is warm and dry.  Neurological:     General: No focal deficit present.     Mental Status: He is alert and oriented to person, place, and time.  Psychiatric:        Mood and Affect: Mood normal.        Behavior: Behavior normal.        Thought Content: Thought content normal.        Judgment: Judgment normal.     No results found for any visits on 01/20/22.      Assessment & Plan:   1. Tick bite of left lower leg, subsequent encounter Patient returned for follow-up visit for suspected lyme disease. His early titers were all negative but he completed a course of antibiotics and his symptoms have resolved. 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 Encouraged to return to office if fatigue persists.   No orders of the defined types were placed in this encounter.   No follow-ups on file.  Rubie Maid, FNP

## 2022-01-24 ENCOUNTER — Other Ambulatory Visit: Payer: Self-pay | Admitting: Family Medicine

## 2022-01-25 NOTE — Telephone Encounter (Signed)
Requested medication (s) are due for refill today - yes  Requested medication (s) are on the active medication list -yes  Future visit scheduled -no  Last refill: 10/01/21 #30 3RF  Notes to clinic: non delegated Rx  Requested Prescriptions  Pending Prescriptions Disp Refills   zolpidem (AMBIEN) 10 MG tablet [Pharmacy Med Name: ZOLPIDEM TARTRATE 10 MG TABLET] 30 tablet 3    Sig: TAKE 1 TABLET BY MOUTH AT BEDTIME AS NEEDED. FOR SLEEP     Not Delegated - Psychiatry:  Anxiolytics/Hypnotics Failed - 01/24/2022  9:39 PM      Failed - This refill cannot be delegated      Failed - Urine Drug Screen completed in last 360 days      Failed - Valid encounter within last 6 months    Recent Outpatient Visits           9 months ago Umbilical hernia without obstruction and without gangrene   [place], Cammie Mcgee, MD   1 year ago Metabolic syndrome   [place] Pickard, Cammie Mcgee, MD   2 years ago General medical exam   [place] Susy Frizzle, MD   2 years ago Right foot pain   [place], [place] Nunnery, MD   6 years ago Acute rhinosinusitis   [place] Susy Frizzle, MD                 Requested Prescriptions  Pending Prescriptions Disp Refills   zolpidem (AMBIEN) 10 MG tablet [Pharmacy Med Name: ZOLPIDEM TARTRATE 10 MG TABLET] 30 tablet 3    Sig: TAKE 1 TABLET BY MOUTH AT BEDTIME AS NEEDED. FOR SLEEP     Not Delegated - Psychiatry:  Anxiolytics/Hypnotics Failed - 01/24/2022  9:39 PM      Failed - This refill cannot be delegated      Failed - Urine Drug Screen completed in last 360 days      Failed - Valid encounter within last 6 months    Recent Outpatient Visits           9 months ago Umbilical hernia without obstruction and without gangrene   [place] Susy Frizzle, MD   1 year ago Metabolic syndrome   [place]  [place], Cammie Mcgee, MD   2 years ago General medical exam   [place] Susy Frizzle, MD   2 years ago Right foot pain   [place], [place] Nunnery, MD   6 years ago Acute rhinosinusitis   [place] Pickard, Cammie Mcgee, MD

## 2022-02-18 LAB — COLOGUARD: COLOGUARD: NEGATIVE

## 2022-05-05 ENCOUNTER — Encounter: Payer: Self-pay | Admitting: Family Medicine

## 2022-05-05 ENCOUNTER — Ambulatory Visit: Payer: BC Managed Care – PPO | Admitting: Family Medicine

## 2022-05-05 VITALS — BP 124/72 | HR 79 | Temp 98.8°F | Ht 67.0 in | Wt 241.0 lb

## 2022-05-05 DIAGNOSIS — R0602 Shortness of breath: Secondary | ICD-10-CM | POA: Diagnosis not present

## 2022-05-05 DIAGNOSIS — J0110 Acute frontal sinusitis, unspecified: Secondary | ICD-10-CM | POA: Diagnosis not present

## 2022-05-05 MED ORDER — PREDNISONE 20 MG PO TABS: ORAL_TABLET | ORAL | 0 refills | Status: DC

## 2022-05-05 MED ORDER — ALPRAZOLAM 0.5 MG PO TABS: 0.5000 mg | ORAL_TABLET | Freq: Three times a day (TID) | ORAL | 0 refills | Status: DC | PRN

## 2022-05-05 MED ORDER — AMOXICILLIN-POT CLAVULANATE 875-125 MG PO TABS: 1.0000 | ORAL_TABLET | Freq: Two times a day (BID) | ORAL | 0 refills | Status: DC

## 2022-05-05 NOTE — Progress Notes (Signed)
Subjective:    Patient ID: Peter Whitehead, male    DOB: Jan 30, 1976, 47 y.o.   MRN: [id number]  HPI  Patient presents today complaining about the excessive need to "yawn."  However when I questioned the patient further he is actually gasping for air.  He states that ever since Christmas, he has been having sinus congestion.  He feels like his nasal passages are obstructed and that he is unable to breathe through his nasal passages.  He has been using Allegra, Flonase, and Astelin with minimal success.  Every minute or so, he feels like he has to take a sudden deep breath to catch his breath.  He will then feel better for a short period of time but the process repeats itself.  During our encounter today, he has to stop and gasp for breath 5 times while were talking.  His father died from heart disease.  His grandfather had a heart attack as well.  Patient denies any chest pain.  He actually states that he breathes better when he is active.  He is able to lift heavy objects and stack heavy material with no shortness of breath.  He is able to go up and down steps with no shortness of breath or chest pain.  He denies any pleurisy, cough, hemoptysis, wheezing.  He does report sinus headache and congestion off and on for the last 4 weeks. Past Medical History:  Diagnosis Date   ADD (attention deficit disorder)    treated during childhood   Gout    Hernia, umbilical    Metabolic syndrome    Past Surgical History:  Procedure Laterality Date   HERNIA REPAIR  07/15/2021   with mesh   pyloric stenosis     Current Outpatient Medications on File Prior to Visit  Medication Sig Dispense Refill   allopurinol (ZYLOPRIM) 300 MG tablet TAKE 1 TABLET BY MOUTH EVERY DAY 90 tablet 3   amphetamine-dextroamphetamine (ADDERALL) 10 MG tablet Take 1 tablet (10 mg total) by mouth 2 (two) times daily. (Patient not taking: Reported on 01/20/2022) 60 tablet 0   Colchicine (MITIGARE) 0.6 MG CAPS Take 1 tablet by  mouth daily. 90 capsule 3   FIBER ADULT GUMMIES PO Take by mouth.     fluticasone (FLONASE) 50 MCG/ACT nasal spray Place into both nostrils daily.     levocetirizine (XYZAL) 5 MG tablet Take 5 mg by mouth every evening.     Misc Natural Products (TART CHERRY ADVANCED) CAPS Take by mouth.     montelukast (SINGULAIR) 10 MG tablet Take 1 tablet (10 mg total) by mouth at bedtime. (Patient not taking: Reported on 01/20/2022) 90 tablet 3   naproxen sodium (ALEVE) 220 MG tablet Take 220 mg by mouth.     TART CHERRY PO Take by mouth.     zolpidem (AMBIEN) 10 MG tablet TAKE 1 TABLET BY MOUTH AT BEDTIME AS NEEDED. FOR SLEEP 30 tablet 3   No current facility-administered medications on file prior to visit.   Allergies  Allergen Reactions   Hydrocodone     Irritability    Social History   Socioeconomic History   Marital status: Divorced    Spouse name: Not on file   Number of children: Not on file   Years of education: Not on file   Highest education level: Not on file  Occupational History   Not on file  Tobacco Use   Smoking status: Former   Smokeless tobacco: Never  Vaping Use  Vaping Use: Never used  Substance and Sexual Activity   Alcohol use: Yes    Alcohol/week: 0.0 standard drinks of alcohol    Comment: Occasional   Drug use: No   Sexual activity: Not on file  Other Topics Concern   Not on file  Social History Narrative   Not on file   Social Determinants of Health   Financial Resource Strain: Not on file  Food Insecurity: Not on file  Transportation Needs: Not on file  Physical Activity: Not on file  Stress: Not on file  Social Connections: Not on file  Intimate Partner Violence: Not on file   Family History  Problem Relation Age of Onset   Arthritis Mother        rheumatoid   Heart disease Father    Hyperlipidemia Father    Hypertension Father    AAA (abdominal aortic aneurysm) Father       Review of Systems  All other systems reviewed and are  negative.      Objective:   Physical Exam Vitals reviewed.  Constitutional:      General: He is not in acute distress.    Appearance: Normal appearance. He is not ill-appearing, toxic-appearing or diaphoretic.  HENT:     Head: Normocephalic and atraumatic.     Right Ear: Tympanic membrane and ear canal normal.     Left Ear: Tympanic membrane and ear canal normal.     Nose: Congestion present. No rhinorrhea.     Right Turbinates: Swollen.     Left Turbinates: Swollen.     Right Sinus: Frontal sinus tenderness present.     Left Sinus: Frontal sinus tenderness present.  Neck:     Vascular: No carotid bruit.  Cardiovascular:     Rate and Rhythm: Normal rate and regular rhythm.     Pulses: Normal pulses.     Heart sounds: Normal heart sounds. No murmur heard.    No friction rub. No gallop.  Pulmonary:     Effort: Pulmonary effort is normal. No respiratory distress.     Breath sounds: Normal breath sounds. No stridor. No wheezing, rhonchi or rales.  Chest:     Chest wall: No tenderness.  Abdominal:     General: Abdomen is flat. Bowel sounds are normal.     Palpations: Abdomen is soft.  Musculoskeletal:     Cervical back: Neck supple. No rigidity or tenderness.  Lymphadenopathy:     Cervical: No cervical adenopathy.  Neurological:     Mental Status: He is alert.           Assessment & Plan:  Shortness of breath - Plan: CT CARDIAC SCORING (SELF PAY ONLY)  Acute frontal sinusitis, recurrence not specified First, I believe the patient is dealing with a sinus infection.  I will treat him with Augmentin and a prednisone taper pack.  However I believe that his episodic gasping due to dyspnea is likely related to anxiety and a sense of smothering.  I will give the patient Xanax to use as needed for anxiety.  I am also going to schedule the patient for a coronary artery CT to determine the extent of any coronary artery calcifications given his strong family history of coronary  artery disease.  I feel that this will help give the patient peace of mind that may alleviate some of his anxiety.  I do not believe that his dyspnea is related to his heart or his lungs given the fact that improves with activity  and he has no chest pain or dyspnea on exertion.  I believe this is more likely a subjective sense of smothering that he alleviates with a gasp.

## 2022-05-16 ENCOUNTER — Encounter: Payer: Self-pay | Admitting: Family Medicine

## 2022-05-18 ENCOUNTER — Encounter: Payer: Self-pay | Admitting: Family Medicine

## 2022-05-18 ENCOUNTER — Other Ambulatory Visit: Payer: Self-pay | Admitting: Family Medicine

## 2022-05-18 ENCOUNTER — Ambulatory Visit (HOSPITAL_COMMUNITY)
Admission: RE | Admit: 2022-05-18 | Discharge: 2022-05-18 | Disposition: A | Discharge status: Home / Self Care | Payer: BC Managed Care – PPO | Source: Ambulatory Visit | Attending: Family Medicine | Admitting: Family Medicine

## 2022-05-18 DIAGNOSIS — R0602 Shortness of breath: Secondary | ICD-10-CM | POA: Insufficient documentation

## 2022-05-19 ENCOUNTER — Other Ambulatory Visit: Payer: Self-pay | Admitting: Family Medicine

## 2022-05-19 ENCOUNTER — Telehealth: Payer: Self-pay

## 2022-05-19 MED ORDER — ZOLPIDEM TARTRATE 10 MG PO TABS: 10.0000 mg | ORAL_TABLET | Freq: Every evening | ORAL | 3 refills | Status: DC | PRN

## 2022-05-19 NOTE — Telephone Encounter (Signed)
Pt called requesting a refill on his Ambien. Last RF was 01/25/2022. Last OV 05/05/2022.  Pt uses [facility]. Thank you.

## 2022-05-19 NOTE — Telephone Encounter (Signed)
Requested medication (s) are due for refill today - yes  Requested medication (s) are on the active medication list -yes  Future visit scheduled -no  Last refill: 01/25/22 #30 3RF  Notes to clinic: non delegated Rx  Requested Prescriptions  Pending Prescriptions Disp Refills   zolpidem (AMBIEN) 10 MG tablet [Pharmacy Med Name: ZOLPIDEM TARTRATE 10 MG TABLET] 30 tablet 3    Sig: TAKE 1 TABLET BY MOUTH AT BEDTIME AS NEEDED. FOR SLEEP     Not Delegated - Psychiatry:  Anxiolytics/Hypnotics Failed - 05/18/2022  9:17 PM      Failed - This refill cannot be delegated      Failed - Urine Drug Screen completed in last 360 days      Failed - Valid encounter within last 6 months    Recent Outpatient Visits           1 year ago Umbilical hernia without obstruction and without gangrene   [place] Susy Frizzle, MD   1 year ago Metabolic syndrome   [place] Pickard, Cammie Mcgee, MD   3 years ago General medical exam   [place] Susy Frizzle, MD   3 years ago Right foot pain   [place], Modena Nunnery, MD   6 years ago Acute rhinosinusitis   [place] Susy Frizzle, MD                 Requested Prescriptions  Pending Prescriptions Disp Refills   zolpidem (AMBIEN) 10 MG tablet [Pharmacy Med Name: ZOLPIDEM TARTRATE 10 MG TABLET] 30 tablet 3    Sig: TAKE 1 TABLET BY MOUTH AT BEDTIME AS NEEDED. FOR SLEEP     Not Delegated - Psychiatry:  Anxiolytics/Hypnotics Failed - 05/18/2022  9:17 PM      Failed - This refill cannot be delegated      Failed - Urine Drug Screen completed in last 360 days      Failed - Valid encounter within last 6 months    Recent Outpatient Visits           1 year ago Umbilical hernia without obstruction and without gangrene   [place] Susy Frizzle, MD   1 year ago Metabolic syndrome   [place],  Cammie Mcgee, MD   3 years ago General medical exam   [place], Warren T, MD   3 years ago Right foot pain   [place], Modena Nunnery, MD   6 years ago Acute rhinosinusitis   [place] Pickard, Cammie Mcgee, MD

## 2022-05-26 ENCOUNTER — Ambulatory Visit: Payer: BC Managed Care – PPO | Admitting: Family Medicine

## 2022-05-26 ENCOUNTER — Encounter: Payer: Self-pay | Admitting: Family Medicine

## 2022-05-26 VITALS — BP 124/72 | HR 87 | Temp 98.0°F | Ht 67.0 in | Wt 251.0 lb

## 2022-05-26 DIAGNOSIS — J3489 Other specified disorders of nose and nasal sinuses: Secondary | ICD-10-CM

## 2022-05-26 NOTE — Progress Notes (Signed)
Subjective:    Patient ID: Peter Whitehead, male    DOB: 11-28-1975, 47 y.o.   MRN: [id number]  Sinus Problem  05/05/22  Patient presents today complaining about the excessive need to "yawn."  However when I questioned the patient further he is actually gasping for air.  He states that ever since Christmas, he has been having sinus congestion.  He feels like his nasal passages are obstructed and that he is unable to breathe through his nasal passages.  He has been using Allegra, Flonase, and Astelin with minimal success.  Every minute or so, he feels like he has to take a sudden deep breath to catch his breath.  He will then feel better for a short period of time but the process repeats itself.  During our encounter today, he has to stop and gasp for breath 5 times while were talking.  His father died from heart disease.  His grandfather had a heart attack as well.  Patient denies any chest pain.  He actually states that he breathes better when he is active.  He is able to lift heavy objects and stack heavy material with no shortness of breath.  He is able to go up and down steps with no shortness of breath or chest pain.  He denies any pleurisy, cough, hemoptysis, wheezing.  He does report sinus headache and congestion off and on for the last 4 weeks.  At that time, my plan was, First, I believe the patient is dealing with a sinus infection.  I will treat him with Augmentin and a prednisone taper pack.  However I believe that his episodic gasping due to dyspnea is likely related to anxiety and a sense of smothering.  I will give the patient Xanax to use as needed for anxiety.  I am also going to schedule the patient for a coronary artery CT to determine the extent of any coronary artery calcifications given his strong family history of coronary artery disease.  I feel that this will help give the patient peace of mind that may alleviate some of his anxiety.  I do not believe that his dyspnea is  related to his heart or his lungs given the fact that improves with activity and he has no chest pain or dyspnea on exertion.  I believe this is more likely a subjective sense of smothering that he alleviates with a gasp.    05/26/22 Patient saw no benefit after taking prednisone and the antibiotic.  Still feels like he is unable to take a full breath through his nose.  Periodically throughout our encounter he has to take a sudden deep breath to alleviate his sense of dyspnea.  However if he breathes through his mouth, he feels like he is getting plenty of air.  The CT scan of his chest revealed no pulmonary lesions.  Also his coronary calcium score was 0 making underlying coronary artery disease unlikely.  He has been trying Flonase and Astepro without any benefit.  Has been taking Zyrtec which helps some.  Xanax did not seem to help.  He does not believe that this is due to underlying anxiety Past Medical History:  Diagnosis Date   ADD (attention deficit disorder)    treated during childhood   Gout    Hernia, umbilical    Metabolic syndrome    Past Surgical History:  Procedure Laterality Date   HERNIA REPAIR  07/15/2021   with mesh   pyloric stenosis  Current Outpatient Medications on File Prior to Visit  Medication Sig Dispense Refill   allopurinol (ZYLOPRIM) 300 MG tablet TAKE 1 TABLET BY MOUTH EVERY DAY 90 tablet 3   ALPRAZolam (XANAX) 0.5 MG tablet Take 1 tablet (0.5 mg total) by mouth 3 (three) times daily as needed for anxiety. 20 tablet 0   amoxicillin-clavulanate (AUGMENTIN) 875-125 MG tablet Take 1 tablet by mouth 2 (two) times daily. 20 tablet 0   Colchicine (MITIGARE) 0.6 MG CAPS Take 1 tablet by mouth daily. 90 capsule 3   FIBER ADULT GUMMIES PO Take by mouth.     Misc Natural Products (TART CHERRY ADVANCED) CAPS Take by mouth.     naproxen sodium (ALEVE) 220 MG tablet Take 220 mg by mouth.     predniSONE (DELTASONE) 20 MG tablet 3 tabs poqday 1-2, 2 tabs poqday 3-4, 1 tab  poqday 5-6 12 tablet 0   TART CHERRY PO Take by mouth.     zolpidem (AMBIEN) 10 MG tablet Take 1 tablet (10 mg total) by mouth at bedtime as needed. for sleep 30 tablet 3   No current facility-administered medications on file prior to visit.   Allergies  Allergen Reactions   Hydrocodone     Irritability    Social History   Socioeconomic History   Marital status: Divorced    Spouse name: Not on file   Number of children: Not on file   Years of education: Not on file   Highest education level: Not on file  Occupational History   Not on file  Tobacco Use   Smoking status: Former   Smokeless tobacco: Never  Vaping Use   Vaping Use: Never used  Substance and Sexual Activity   Alcohol use: Yes    Alcohol/week: 0.0 standard drinks of alcohol    Comment: Occasional   Drug use: No   Sexual activity: Not on file  Other Topics Concern   Not on file  Social History Narrative   Not on file   Social Determinants of Health   Financial Resource Strain: Not on file  Food Insecurity: Not on file  Transportation Needs: Not on file  Physical Activity: Not on file  Stress: Not on file  Social Connections: Not on file  Intimate Partner Violence: Not on file   Family History  Problem Relation Age of Onset   Arthritis Mother        rheumatoid   Heart disease Father    Hyperlipidemia Father    Hypertension Father    AAA (abdominal aortic aneurysm) Father       Review of Systems  All other systems reviewed and are negative.      Objective:   Physical Exam Vitals reviewed.  Constitutional:      General: He is not in acute distress.    Appearance: Normal appearance. He is not ill-appearing, toxic-appearing or diaphoretic.  HENT:     Head: Normocephalic and atraumatic.     Right Ear: Tympanic membrane and ear canal normal.     Left Ear: Tympanic membrane and ear canal normal.     Nose: No congestion or rhinorrhea.     Right Turbinates: Not swollen.     Left Turbinates:  Not swollen.     Right Sinus: No frontal sinus tenderness.     Left Sinus: No frontal sinus tenderness.  Neck:     Vascular: No carotid bruit.  Cardiovascular:     Rate and Rhythm: Normal rate and regular rhythm.  Pulses: Normal pulses.     Heart sounds: Normal heart sounds. No murmur heard.    No friction rub. No gallop.  Pulmonary:     Effort: Pulmonary effort is normal. No respiratory distress.     Breath sounds: Normal breath sounds. No stridor. No wheezing, rhonchi or rales.  Chest:     Chest wall: No tenderness.  Abdominal:     General: Abdomen is flat. Bowel sounds are normal.     Palpations: Abdomen is soft.  Musculoskeletal:     Cervical back: Neck supple. No rigidity or tenderness.  Lymphadenopathy:     Cervical: No cervical adenopathy.  Neurological:     Mental Status: He is alert.           Assessment & Plan:  Nasal obstruction Patient has a subjective sensation of the nasal obstruction.  I tried treating the patient empirically for sinus infection.  He has tried [employer] pot.  He has tried nasal steroids.  He has tried Astelin.  I also worked patient up for underlying coronary artery disease as well as pulmonary issues with a CT cardiac score.  Therefore nothing has helped.  I believe this is likely anxiety related.  I had this discussion with the patient.  We discussed possibly getting an echocardiogram to evaluate for any cardiomyopathy however the patient states he can breathe fine as long as he breathes through his mouth.  Therefore I believe the next prudent course of action will be an ENT consultation for direct examination of the nasopharynx.  Consult ENT

## 2022-05-30 ENCOUNTER — Ambulatory Visit: Payer: BC Managed Care – PPO | Admitting: Family Medicine

## 2022-09-14 ENCOUNTER — Other Ambulatory Visit: Payer: Self-pay | Admitting: Family Medicine

## 2022-09-15 ENCOUNTER — Encounter: Payer: Self-pay | Admitting: Family Medicine

## 2022-09-15 NOTE — Telephone Encounter (Signed)
Requested medication (s) are due for refill today:   Yes for both  Requested medication (s) are on the active medication list:   Yes for both  Future visit scheduled:   Yes 09/19/2022    LOV 05/05/2022   Last ordered: Ambien 05/19/2022 #30, 3 refills;   Allopurinol 10/01/2021 #90, 3 refills  Returned because labs are due and a non delegated refill    Requested Prescriptions  Pending Prescriptions Disp Refills   zolpidem (AMBIEN) 10 MG tablet [Pharmacy Med Name: ZOLPIDEM TARTRATE 10 MG TABLET] 30 tablet 3    Sig: Take 1 tablet (10 mg total) by mouth at bedtime as needed. for sleep     Not Delegated - Psychiatry:  Anxiolytics/Hypnotics Failed - 09/14/2022 10:07 PM      Failed - This refill cannot be delegated      Failed - Urine Drug Screen completed in last 360 days      Failed - Valid encounter within last 6 months    Recent Outpatient Visits           1 year ago Umbilical hernia without obstruction and without gangrene   [facility] Donita Brooks, MD   2 years ago Metabolic syndrome   [facility] Pickard, Priscille Heidelberg, MD   3 years ago General medical exam   [facility] Donita Brooks, MD   3 years ago Right foot pain   [facility] [place], Velna Hatchet, MD   7 years ago Acute rhinosinusitis   [facility] Pickard, Priscille Heidelberg, MD       Future Appointments             In 4 days Pickard, Priscille Heidelberg, MD [facility]             allopurinol (ZYLOPRIM) 300 MG tablet [Pharmacy Med Name: ALLOPURINOL 300 MG TABLET] 90 tablet 3    Sig: TAKE 1 TABLET BY MOUTH EVERY DAY     Endocrinology:  Gout Agents - allopurinol Failed - 09/14/2022 10:07 PM      Failed - Uric Acid in normal range and within 360 days    Uric Acid, Serum  Date Value Ref Range Status  09/17/2021 4.1 4.0 - 8.0 mg/dL Final    Comment:    Therapeutic target for gout patients: <6.0 mg/dL .           Failed - Cr in normal range and within 360 days    Creat  Date Value Ref Range Status  09/17/2021 0.82 0.60 - 1.29 mg/dL Final         Failed - Valid encounter within last 12 months    Recent Outpatient Visits           1 year ago Umbilical hernia without obstruction and without gangrene   [facility] Pickard, Priscille Heidelberg, MD   2 years ago Metabolic syndrome   [facility] [place], Priscille Heidelberg, MD   3 years ago General medical exam   [facility] Donita Brooks, MD   3 years ago Right foot pain   [facility] [place], Velna Hatchet, MD   7 years ago Acute rhinosinusitis   [facility] Pickard, Priscille Heidelberg, MD       Future Appointments             In 4 days Donita Brooks, MD  [place] [facility]            Failed - CBC within normal limits and completed in the last 12 months    WBC  Date Value Ref Range Status  09/17/2021 7.8 3.8 - 10.8 Thousand/uL Final   RBC  Date Value Ref Range Status  09/17/2021 5.40 4.20 - 5.80 Million/uL Final   Hemoglobin  Date Value Ref Range Status  09/17/2021 16.9 13.2 - 17.1 g/dL Final   HCT  Date Value Ref Range Status  09/17/2021 47.7 38.5 - 50.0 % Final   MCHC  Date Value Ref Range Status  09/17/2021 35.4 32.0 - 36.0 g/dL Final   [facility]  Date Value Ref Range Status  09/17/2021 31.3 27.0 - 33.0 pg Final   MCV  Date Value Ref Range Status  09/17/2021 88.3 80.0 - 100.0 fL Final   No results found for: "PLTCOUNTKUC", "LABPLAT", "POCPLA" RDW  Date Value Ref Range Status  09/17/2021 12.2 11.0 - 15.0 % Final

## 2022-09-16 ENCOUNTER — Other Ambulatory Visit: Payer: Self-pay | Admitting: Family Medicine

## 2022-09-16 ENCOUNTER — Telehealth: Payer: Self-pay

## 2022-09-16 MED ORDER — ALLOPURINOL 300 MG PO TABS: 300.0000 mg | ORAL_TABLET | Freq: Every day | ORAL | 3 refills | Status: DC

## 2022-09-16 MED ORDER — ZOLPIDEM TARTRATE 10 MG PO TABS: 10.0000 mg | ORAL_TABLET | Freq: Every evening | ORAL | 3 refills | Status: DC | PRN

## 2022-09-16 NOTE — Telephone Encounter (Signed)
Pt sent my chart message requesting a refill on Ambien and Allopurinol.  Ambien, last RF 05/19/2022. Allopurinol, last RF 10/01/2021.  Pt has an appointment with you on 09/19/2022. Thank you.

## 2022-09-19 ENCOUNTER — Ambulatory Visit: Payer: BC Managed Care – PPO | Admitting: Family Medicine

## 2022-09-19 VITALS — BP 124/72 | HR 83 | Temp 98.2°F | Ht 67.0 in | Wt 245.2 lb

## 2022-09-19 DIAGNOSIS — F419 Anxiety disorder, unspecified: Secondary | ICD-10-CM | POA: Diagnosis not present

## 2022-09-19 NOTE — Progress Notes (Signed)
Subjective:    Patient ID: Peter Whitehead, male    DOB: 07-27-1975, 47 y.o.   MRN: [id number]  HPI 05/05/22 Patient presents today complaining about the excessive need to "yawn."  However when I questioned the patient further he is actually gasping for air.  He states that ever since Christmas, he has been having sinus congestion.  He feels like his nasal passages are obstructed and that he is unable to breathe through his nasal passages.  He has been using Allegra, Flonase, and Astelin with minimal success.  Every minute or so, he feels like he has to take a sudden deep breath to catch his breath.  He will then feel better for a short period of time but the process repeats itself.  During our encounter today, he has to stop and gasp for breath 5 times while were talking.  His father died from heart disease.  His grandfather had a heart attack as well.  Patient denies any chest pain.  He actually states that he breathes better when he is active.  He is able to lift heavy objects and stack heavy material with no shortness of breath.  He is able to go up and down steps with no shortness of breath or chest pain.  He denies any pleurisy, cough, hemoptysis, wheezing.  He does report sinus headache and congestion off and on for the last 4 weeks.  At that time, my plan was: First, I believe the patient is dealing with a sinus infection.  I will treat him with Augmentin and a prednisone taper pack.  However I believe that his episodic gasping due to dyspnea is likely related to anxiety and a sense of smothering.  I will give the patient Xanax to use as needed for anxiety.  I am also going to schedule the patient for a coronary artery CT to determine the extent of any coronary artery calcifications given his strong family history of coronary artery disease.  I feel that this will help give the patient peace of mind that may alleviate some of his anxiety.  I do not believe that his dyspnea is related to his  heart or his lungs given the fact that improves with activity and he has no chest pain or dyspnea on exertion.  I believe this is more likely a subjective sense of smothering that he alleviates with a gasp.    09/19/22 Patient states that he has been dealing with more stress.  He feels like he is not handling the stress well.  He finds himself more irritable, losing his temper more quickly, having less patience, feeling more anxious.  He states that he feels a lot of the symptoms he was having that we discussed in February.  He is not having full-blown panic attacks.  He did not like the way Xanax made him feel.  He denies any depression or anhedonia but he does feel anxious on a daily basis and has trouble sleeping.  He would like something to try to help mitigate anxiety that is not habit-forming. Past Medical History:  Diagnosis Date  . ADD (attention deficit disorder)    treated during childhood  . Gout   . Hernia, umbilical   . Metabolic syndrome    Past Surgical History:  Procedure Laterality Date  . HERNIA REPAIR  07/15/2021   with mesh  . pyloric stenosis     Current Outpatient Medications on File Prior to Visit  Medication Sig Dispense Refill  . allopurinol (  ZYLOPRIM) 300 MG tablet Take 1 tablet (300 mg total) by mouth daily. 90 tablet 3  . ALPRAZolam (XANAX) 0.5 MG tablet Take 1 tablet (0.5 mg total) by mouth 3 (three) times daily as needed for anxiety. 20 tablet 0  . amoxicillin-clavulanate (AUGMENTIN) 875-125 MG tablet Take 1 tablet by mouth 2 (two) times daily. 20 tablet 0  . Colchicine (MITIGARE) 0.6 MG CAPS Take 1 tablet by mouth daily. 90 capsule 3  . FIBER ADULT GUMMIES PO Take by mouth.    . Misc Natural Products (TART CHERRY ADVANCED) CAPS Take by mouth.    . naproxen sodium (ALEVE) 220 MG tablet Take 220 mg by mouth.    . predniSONE (DELTASONE) 20 MG tablet 3 tabs poqday 1-2, 2 tabs poqday 3-4, 1 tab poqday 5-6 12 tablet 0  . TART CHERRY PO Take by mouth.    . zolpidem  (AMBIEN) 10 MG tablet Take 1 tablet (10 mg total) by mouth at bedtime as needed. for sleep 30 tablet 3   No current facility-administered medications on file prior to visit.   Allergies  Allergen Reactions  . Hydrocodone     Irritability    Social History   Socioeconomic History  . Marital status: Divorced    Spouse name: Not on file  . Number of children: Not on file  . Years of education: Not on file  . Highest education level: 12th grade  Occupational History  . Not on file  Tobacco Use  . Smoking status: Former  . Smokeless tobacco: Never  Vaping Use  . Vaping Use: Never used  Substance and Sexual Activity  . Alcohol use: Yes    Alcohol/week: 0.0 standard drinks of alcohol    Comment: Occasional  . Drug use: No  . Sexual activity: Not on file  Other Topics Concern  . Not on file  Social History Narrative  . Not on file   Social Determinants of Health   Financial Resource Strain: Patient Declined (09/19/2022)   Overall Financial Resource Strain (CARDIA)   . Difficulty of Paying Living Expenses: Patient declined  Food Insecurity: No Food Insecurity (09/19/2022)   Hunger Vital Sign   . Worried About Programme researcher, broadcasting/film/video in the Last Year: Never true   . Ran Out of Food in the Last Year: Never true  Transportation Needs: No Transportation Needs (09/19/2022)   PRAPARE - Transportation   . Lack of Transportation (Medical): No   . Lack of Transportation (Non-Medical): No  Physical Activity: Insufficiently Active (09/19/2022)   Exercise Vital Sign   . Days of Exercise per Week: 5 days   . Minutes of Exercise per Session: 20 min  Stress: Patient Declined (09/19/2022)   [employer] of Occupational Health - Occupational Stress Questionnaire   . Feeling of Stress : Patient declined  Social Connections: Unknown (09/19/2022)   Social Connection and Isolation Panel [NHANES]   . Frequency of Communication with Friends and Family: More than three times a week   . Frequency  of Social Gatherings with Friends and Family: More than three times a week   . Attends Religious Services: Patient declined   . Active Member of Clubs or Organizations: Patient declined   . Attends Banker Meetings: Not on file   . Marital Status: Divorced  Catering manager Violence: Not on file   Family History  Problem Relation Age of Onset  . Arthritis Mother        rheumatoid  . Heart disease Father   .  Hyperlipidemia Father   . Hypertension Father   . AAA (abdominal aortic aneurysm) Father       Review of Systems  All other systems reviewed and are negative.      Objective:   Physical Exam Vitals reviewed.  Constitutional:      General: He is not in acute distress.    Appearance: Normal appearance. He is not ill-appearing, toxic-appearing or diaphoretic.  HENT:     Head: Normocephalic and atraumatic.  Cardiovascular:     Rate and Rhythm: Normal rate and regular rhythm.     Pulses: Normal pulses.     Heart sounds: Normal heart sounds. No murmur heard.    No friction rub. No gallop.  Pulmonary:     Effort: Pulmonary effort is normal. No respiratory distress.     Breath sounds: Normal breath sounds. No stridor. No wheezing, rhonchi or rales.  Chest:     Chest wall: No tenderness.  Neurological:     Mental Status: He is alert.          Assessment & Plan:  Anxiety I believe the patient is dealing with anxiety and may have generalized anxiety disorder.  We will try the patient on Lexapro 10 mg daily and then reassess in 4 to 6 weeks.

## 2022-09-20 ENCOUNTER — Other Ambulatory Visit: Payer: Self-pay

## 2022-09-20 ENCOUNTER — Encounter: Payer: Self-pay | Admitting: Family Medicine

## 2022-09-20 DIAGNOSIS — F419 Anxiety disorder, unspecified: Secondary | ICD-10-CM

## 2022-09-20 MED ORDER — ESCITALOPRAM OXALATE 10 MG PO TABS
10.0000 mg | ORAL_TABLET | Freq: Every day | ORAL | 1 refills | Status: AC
Start: 2022-09-20 — End: ?

## 2022-10-12 ENCOUNTER — Other Ambulatory Visit: Payer: Self-pay | Admitting: Family Medicine

## 2022-10-12 DIAGNOSIS — F419 Anxiety disorder, unspecified: Secondary | ICD-10-CM

## 2022-10-13 NOTE — Telephone Encounter (Signed)
Requested Prescriptions  Pending Prescriptions Disp Refills   escitalopram (LEXAPRO) 10 MG tablet [Pharmacy Med Name: ESCITALOPRAM 10 MG TABLET] 90 tablet 0    Sig: TAKE 1 TABLET BY MOUTH EVERY DAY     Psychiatry:  Antidepressants - SSRI Failed - 10/12/2022 12:32 PM      Failed - Valid encounter within last 6 months    Recent Outpatient Visits           1 year ago Umbilical hernia without obstruction and without gangrene   [facility] Donita Brooks, MD   2 years ago Metabolic syndrome   [facility] [place], Priscille Heidelberg, MD   3 years ago General medical exam   [facility] Donita Brooks, MD   3 years ago Right foot pain   [facility] [place], Velna Hatchet, MD   7 years ago Acute rhinosinusitis   [facility] [place], Priscille Heidelberg, MD

## 2022-10-14 ENCOUNTER — Encounter: Payer: Self-pay | Admitting: Family Medicine

## 2023-01-09 ENCOUNTER — Other Ambulatory Visit: Payer: Self-pay | Admitting: Family Medicine

## 2023-01-16 ENCOUNTER — Telehealth: Payer: Self-pay

## 2023-01-16 ENCOUNTER — Other Ambulatory Visit: Payer: Self-pay

## 2023-01-16 DIAGNOSIS — F419 Anxiety disorder, unspecified: Secondary | ICD-10-CM

## 2023-01-16 MED ORDER — ESCITALOPRAM OXALATE 10 MG PO TABS
10.0000 mg | ORAL_TABLET | Freq: Every day | ORAL | 1 refills | Status: DC
Start: 2023-01-16 — End: 2023-07-11

## 2023-01-16 NOTE — Telephone Encounter (Signed)
Prescription Request  01/16/2023  LOV: 09/19/22  What is the name of the medication or equipment? escitalopram (LEXAPRO) 10 MG tablet [[id number]]  Have you contacted your pharmacy to request a refill? Yes   Which pharmacy would you like this sent to?  [id number] [place], [place] - [address] [place] [address] Phone: [phone]  Fax: [phone]    Patient notified that their request is being sent to the clinical staff for review and that they should receive a response within 2 business days.   Please advise at Mobile [phone] (mobile)

## 2023-05-11 ENCOUNTER — Encounter: Payer: Self-pay | Admitting: Family Medicine

## 2023-05-12 MED ORDER — ZOLPIDEM TARTRATE 10 MG PO TABS: 10.0000 mg | ORAL_TABLET | Freq: Every evening | ORAL | 3 refills | Status: DC | PRN

## 2023-07-10 ENCOUNTER — Encounter: Payer: Self-pay | Admitting: Family Medicine

## 2023-07-11 ENCOUNTER — Other Ambulatory Visit: Payer: Self-pay

## 2023-07-11 DIAGNOSIS — F419 Anxiety disorder, unspecified: Secondary | ICD-10-CM

## 2023-07-11 MED ORDER — ESCITALOPRAM OXALATE 10 MG PO TABS
10.0000 mg | ORAL_TABLET | Freq: Every day | ORAL | 1 refills | Status: DC
Start: 2023-07-11 — End: 2024-01-03

## 2023-09-04 ENCOUNTER — Other Ambulatory Visit: Payer: Self-pay | Admitting: Family Medicine

## 2024-01-03 ENCOUNTER — Other Ambulatory Visit: Payer: Self-pay | Admitting: Family Medicine

## 2024-01-03 DIAGNOSIS — F419 Anxiety disorder, unspecified: Secondary | ICD-10-CM

## 2024-01-05 ENCOUNTER — Other Ambulatory Visit: Payer: Self-pay | Admitting: Family Medicine

## 2024-01-05 MED ORDER — ZOLPIDEM TARTRATE 10 MG PO TABS: 10.0000 mg | ORAL_TABLET | Freq: Every evening | ORAL | 3 refills | Status: DC | PRN

## 2024-01-09 ENCOUNTER — Ambulatory Visit: Admitting: Family Medicine

## 2024-01-09 ENCOUNTER — Encounter: Payer: Self-pay | Admitting: Family Medicine

## 2024-01-09 VITALS — BP 132/66 | HR 85 | Temp 98.4°F | Ht 67.0 in | Wt 268.0 lb

## 2024-01-09 DIAGNOSIS — F5104 Psychophysiologic insomnia: Secondary | ICD-10-CM | POA: Diagnosis not present

## 2024-01-09 DIAGNOSIS — R739 Hyperglycemia, unspecified: Secondary | ICD-10-CM | POA: Diagnosis not present

## 2024-01-09 DIAGNOSIS — F419 Anxiety disorder, unspecified: Secondary | ICD-10-CM

## 2024-01-09 DIAGNOSIS — R0989 Other specified symptoms and signs involving the circulatory and respiratory systems: Secondary | ICD-10-CM

## 2024-01-09 DIAGNOSIS — E7800 Pure hypercholesterolemia, unspecified: Secondary | ICD-10-CM | POA: Diagnosis not present

## 2024-01-09 MED ORDER — ZOLPIDEM TARTRATE 10 MG PO TABS: 10.0000 mg | ORAL_TABLET | Freq: Every evening | ORAL | 3 refills | Status: AC | PRN

## 2024-01-09 MED ORDER — HYDROXYZINE PAMOATE 25 MG PO CAPS: 25.0000 mg | ORAL_CAPSULE | Freq: Every evening | ORAL | 3 refills | Status: DC | PRN

## 2024-01-09 NOTE — Progress Notes (Signed)
 Subjective:    Patient ID: Peter Whitehead, male    DOB: [birth year], 48 y.o.   MRN: [id number]  HPI 05/05/22 Patient presents today complaining about the excessive need to yawn.  However when I questioned the patient further he is actually gasping for air.  He states that ever since Christmas, he has been having sinus congestion.  He feels like his nasal passages are obstructed and that he is unable to breathe through his nasal passages.  He has been using Allegra, Flonase, and Astelin with minimal success.  Every minute or so, he feels like he has to take a sudden deep breath to catch his breath.  He will then feel better for a short period of time but the process repeats itself.  During our encounter today, he has to stop and gasp for breath 5 times while were talking.  His father died from heart disease.  His grandfather had a heart attack as well.  Patient denies any chest pain.  He actually states that he breathes better when he is active.  He is able to lift heavy objects and stack heavy material with no shortness of breath.  He is able to go up and down steps with no shortness of breath or chest pain.  He denies any pleurisy, cough, hemoptysis, wheezing.  He does report sinus headache and congestion off and on for the last 4 weeks.  At that time, my plan was: First, I believe the patient is dealing with a sinus infection.  I will treat him with Augmentin  and a prednisone  taper pack.  However I believe that his episodic gasping due to dyspnea is likely related to anxiety and a sense of smothering.  I will give the patient Xanax  to use as needed for anxiety.  I am also going to schedule the patient for a coronary artery CT to determine the extent of any coronary artery calcifications given his strong family history of coronary artery disease.  I feel that this will help give the patient peace of mind that may alleviate some of his anxiety.  I do not believe that his dyspnea is related to his  heart or his lungs given the fact that improves with activity and he has no chest pain or dyspnea on exertion.  I believe this is more likely a subjective sense of smothering that he alleviates with a gasp.    09/19/22 Patient states that he has been dealing with more stress.  He feels like he is not handling the stress well.  He finds himself more irritable, losing his temper more quickly, having less patience, feeling more anxious.  He states that he feels a lot of the symptoms he was having that we discussed in February.  He is not having full-blown panic attacks.  He did not like the way Xanax  made him feel.  He denies any depression or anhedonia but he does feel anxious on a daily basis and has trouble sleeping.  He would like something to try to help mitigate anxiety that is not habit-forming.  01/09/24 Patient is here today for a checkup.  He is requesting a refill on his Ambien .  He states he has not been having to take the Ambien  every night for the last 5 years in order for him to sleep.  Without the medication he finds himself lying in bed staring at the ceiling.  He is overdue to check fasting lab work.  His blood pressure today is acceptable at 132/66.  He declines a flu shot.  He is due for Cologuard next year.  He continues to have occasional yawning.  He admits that it tends to happen during periods of stress.  And embarrassing to him when it occurs in a professional environment.  After discussing with the patient, he also has a family history of Tourette's syndrome.  Both his brother and his sister have Tourette's.  The patient denies any verbal tics however I do not consider this to be a motor tic until just now.  Patient also has an underlying history of ADD.  He does think the Lexapro  is helping with anxiety.  He wants to continue that Past Medical History:  Diagnosis Date   ADD (attention deficit disorder)    treated during childhood   Gout    Hernia, umbilical    Metabolic syndrome     Past Surgical History:  Procedure Laterality Date   HERNIA REPAIR  07/15/2021   with mesh   pyloric stenosis     Current Outpatient Medications on File Prior to Visit  Medication Sig Dispense Refill   allopurinol  (ZYLOPRIM ) 300 MG tablet TAKE 1 TABLET BY MOUTH EVERY DAY 90 tablet 3   Colchicine  (MITIGARE ) 0.6 MG CAPS Take 1 tablet by mouth daily. 90 capsule 3   escitalopram  (LEXAPRO ) 10 MG tablet TAKE 1 TABLET BY MOUTH EVERY DAY 30 tablet 0   No current facility-administered medications on file prior to visit.   Allergies  Allergen Reactions   Hydrocodone      Irritability    Social History   Socioeconomic History   Marital status: Divorced    Spouse name: Not on file   Number of children: Not on file   Years of education: Not on file   Highest education level: 12th grade  Occupational History   Not on file  Tobacco Use   Smoking status: Former   Smokeless tobacco: Never  Vaping Use   Vaping status: Never Used  Substance and Sexual Activity   Alcohol use: Yes    Alcohol/week: 0.0 standard drinks of alcohol    Comment: Occasional   Drug use: No   Sexual activity: Not on file  Other Topics Concern   Not on file  Social History Narrative   Not on file   Social Drivers of Health   Financial Resource Strain: Patient Declined (09/19/2022)   Overall Financial Resource Strain (CARDIA)    Difficulty of Paying Living Expenses: Patient declined  Food Insecurity: No Food Insecurity (09/19/2022)   Hunger Vital Sign    Worried About Running Out of Food in the Last Year: Never true    Ran Out of Food in the Last Year: Never true  Transportation Needs: No Transportation Needs (09/19/2022)   PRAPARE - Administrator, Civil Service (Medical): No    Lack of Transportation (Non-Medical): No  Physical Activity: Insufficiently Active (09/19/2022)   Exercise Vital Sign    Days of Exercise per Week: 5 days    Minutes of Exercise per Session: 20 min  Stress: Patient  Declined (09/19/2022)   [employer] of Occupational Health - Occupational Stress Questionnaire    Feeling of Stress : Patient declined  Social Connections: Unknown (09/19/2022)   Social Connection and Isolation Panel    Frequency of Communication with Friends and Family: More than three times a week    Frequency of Social Gatherings with Friends and Family: More than three times a week    Attends Religious Services: Patient declined  Active Member of Clubs or Organizations: Patient declined    Attends Banker Meetings: Not on file    Marital Status: Divorced  Intimate Partner Violence: Not on file   Family History  Problem Relation Age of Onset   Arthritis Mother        rheumatoid   Heart disease Father    Hyperlipidemia Father    Hypertension Father    AAA (abdominal aortic aneurysm) Father       Review of Systems  All other systems reviewed and are negative.      Objective:   Physical Exam Vitals reviewed.  Constitutional:      General: He is not in acute distress.    Appearance: Normal appearance. He is not ill-appearing, toxic-appearing or diaphoretic.  HENT:     Head: Normocephalic and atraumatic.  Cardiovascular:     Rate and Rhythm: Normal rate and regular rhythm.     Pulses: Normal pulses.     Heart sounds: Normal heart sounds. No murmur heard.    No friction rub. No gallop.  Pulmonary:     Effort: Pulmonary effort is normal. No respiratory distress.     Breath sounds: No stridor. Rhonchi present. No wheezing or rales.    Chest:     Chest wall: No tenderness.  Neurological:     Mental Status: He is alert.           Assessment & Plan:  Anxiety  Chronic insomnia  Pure hypercholesterolemia - Plan: CBC with Differential/Platelet, Comprehensive metabolic panel with GFR, Lipid panel, Hemoglobin A1c  Elevated blood sugar - Plan: CBC with Differential/Platelet, Comprehensive metabolic panel with GFR, Lipid panel, Hemoglobin  A1c  Abnormal breath sounds - Plan: DG Chest 2 View Patient has seen benefit from Lexapro  so we will continue that medication.  I would like him to return fasting for CBC CMP lipid panel and given a history of an elevated sugar in the past I would like to check his A1c.  Unfortunately the patient has gained substantial weight recently and I am concerned about the development of type 2 diabetes.  On his exam there is a right basilar rhonchorous breath sound that was persistent.  The patient has no history to suggest the cause of that.  Therefore I would like to get a chest x-ray.  I recommended trying hydroxyzine 25 mg at night to help him sleep over the weekend.  If he notices improvement, I would like him to try to slowly transition from Ambien  to hydroxyzine to avoid habituation and dependency and dose escalation.  If he does not see improvement we will continue the Ambien .  I am now concerned that the yawning may be a motor tic related to Tourette's syndrome.  He has seen improvement with Lexapro  treating anxiety.  He does have an underlying history of ADD.  Therefore if lab work is normal and chest x-ray is normal, I would consider trying the patient on clonidine to see if this would help suppress some of the yawning as a motor tic.

## 2024-01-10 ENCOUNTER — Encounter: Payer: Self-pay | Admitting: Family Medicine

## 2024-01-10 ENCOUNTER — Ambulatory Visit
Admission: RE | Admit: 2024-01-10 | Discharge: 2024-01-10 | Disposition: A | Discharge status: Home / Self Care | Source: Ambulatory Visit | Attending: Family Medicine | Admitting: Family Medicine

## 2024-01-10 DIAGNOSIS — R0989 Other specified symptoms and signs involving the circulatory and respiratory systems: Secondary | ICD-10-CM

## 2024-01-12 ENCOUNTER — Other Ambulatory Visit: Payer: Self-pay | Admitting: Family Medicine

## 2024-01-12 ENCOUNTER — Other Ambulatory Visit

## 2024-01-12 MED ORDER — CLONIDINE HCL 0.1 MG PO TABS: 0.1000 mg | ORAL_TABLET | Freq: Every day | ORAL | 3 refills | Status: DC

## 2024-01-13 LAB — CBC WITH DIFFERENTIAL/PLATELET
Absolute Lymphocytes: 1940 {cells}/uL (ref 850–3900)
Absolute Monocytes: 516 {cells}/uL (ref 200–950)
Basophils Absolute: 77 {cells}/uL (ref 0–200)
Basophils Relative: 1 %
Eosinophils Absolute: 285 {cells}/uL (ref 15–500)
Eosinophils Relative: 3.7 %
HCT: 48.1 % (ref 38.5–50.0)
Hemoglobin: 16.3 g/dL (ref 13.2–17.1)
MCH: 30.4 pg (ref 27.0–33.0)
MCHC: 33.9 g/dL (ref 32.0–36.0)
MCV: 89.7 fL (ref 80.0–100.0)
MPV: 11.1 fL (ref 7.5–12.5)
Monocytes Relative: 6.7 %
Neutro Abs: 4882 {cells}/uL (ref 1500–7800)
Neutrophils Relative %: 63.4 %
Platelets: 205 Thousand/uL (ref 140–400)
RBC: 5.36 Million/uL (ref 4.20–5.80)
RDW: 12.3 % (ref 11.0–15.0)
Total Lymphocyte: 25.2 %
WBC: 7.7 Thousand/uL (ref 3.8–10.8)

## 2024-01-13 LAB — COMPREHENSIVE METABOLIC PANEL WITH GFR
AG Ratio: 2.3 (calc) (ref 1.0–2.5)
ALT: 41 U/L (ref 9–46)
AST: 25 U/L (ref 10–40)
Albumin: 5 g/dL (ref 3.6–5.1)
Alkaline phosphatase (APISO): 78 U/L (ref 36–130)
BUN: 14 mg/dL (ref 7–25)
CO2: 24 mmol/L (ref 20–32)
Calcium: 9.6 mg/dL (ref 8.6–10.3)
Chloride: 103 mmol/L (ref 98–110)
Creat: 0.71 mg/dL (ref 0.60–1.29)
Globulin: 2.2 g/dL (ref 1.9–3.7)
Glucose, Bld: 122 mg/dL — ABNORMAL HIGH (ref 65–99)
Potassium: 4 mmol/L (ref 3.5–5.3)
Sodium: 137 mmol/L (ref 135–146)
Total Bilirubin: 0.8 mg/dL (ref 0.2–1.2)
Total Protein: 7.2 g/dL (ref 6.1–8.1)
eGFR: 113 mL/min/1.73m2 (ref >=60)

## 2024-01-13 LAB — LIPID PANEL
Cholesterol: 205 mg/dL — ABNORMAL HIGH (ref <200)
HDL: 39 mg/dL — ABNORMAL LOW (ref >=40)
LDL Cholesterol (Calc): 126 mg/dL — ABNORMAL HIGH
Non-HDL Cholesterol (Calc): 166 mg/dL — ABNORMAL HIGH (ref <130)
Total CHOL/HDL Ratio: 5.3 (calc) — ABNORMAL HIGH (ref <5.0)
Triglycerides: 260 mg/dL — ABNORMAL HIGH (ref <150)

## 2024-01-13 LAB — HEMOGLOBIN A1C
Hgb A1c MFr Bld: 6.1 % — ABNORMAL HIGH (ref <5.7)
Mean Plasma Glucose: 128 mg/dL
eAG (mmol/L): 7.1 mmol/L

## 2024-01-15 ENCOUNTER — Ambulatory Visit: Payer: Self-pay | Admitting: Family Medicine

## 2024-01-17 ENCOUNTER — Other Ambulatory Visit

## 2024-02-02 ENCOUNTER — Other Ambulatory Visit: Payer: Self-pay | Admitting: Family Medicine

## 2024-02-02 DIAGNOSIS — F419 Anxiety disorder, unspecified: Secondary | ICD-10-CM

## 2024-03-12 ENCOUNTER — Encounter: Payer: Self-pay | Admitting: Family Medicine

## 2024-05-08 ENCOUNTER — Other Ambulatory Visit: Payer: Self-pay | Admitting: Family Medicine

## 2024-05-08 NOTE — Telephone Encounter (Signed)
 Peter Whitehead

## 2024-05-10 MED ORDER — CLONIDINE HCL 0.1 MG PO TABS: 0.1000 mg | ORAL_TABLET | Freq: Every day | ORAL | 0 refills | Status: AC

## 2024-05-10 NOTE — Telephone Encounter (Signed)
 Requested Prescriptions  Pending Prescriptions Disp Refills   cloNIDine  (CATAPRES ) 0.1 MG tablet 90 tablet 0    Sig: Take 1 tablet (0.1 mg total) by mouth daily.     Cardiovascular:  Alpha-2 Agonists Passed - 05/10/2024 11:38 AM      Passed - Last BP in normal range    BP Readings from Last 1 Encounters:  01/09/24 132/66         Passed - Last Heart Rate in normal range    Pulse Readings from Last 1 Encounters:  01/09/24 85         Passed - Valid encounter within last 6 months    Recent Outpatient Visits           4 months ago Anxiety   [facility] Duanne Butler DASEN, MD   1 year ago Anxiety   [facility] Duanne Butler DASEN, MD   1 year ago Nasal obstruction   [facility] Duanne Butler DASEN, MD   2 years ago Shortness of breath   [facility] Duanne Butler DASEN, MD   2 years ago Tick bite of left lower leg, subsequent encounter   [facility] Kayla Jeoffrey RAMAN, FNP
